# Patient Record
Sex: Male | Born: 1965 | Race: White | Hispanic: No | Marital: Married | Smoking: Never smoker
Health system: Southern US, Community
[De-identification: ages and names within clinical notes are randomized; demographics above are authoritative.]

## PROBLEM LIST (undated history)

## (undated) DIAGNOSIS — S99919A Unspecified injury of unspecified ankle, initial encounter: Secondary | ICD-10-CM

## (undated) DIAGNOSIS — M545 Low back pain: Secondary | ICD-10-CM

## (undated) DIAGNOSIS — G4733 Obstructive sleep apnea (adult) (pediatric): Secondary | ICD-10-CM

## (undated) DIAGNOSIS — Z8619 Personal history of other infectious and parasitic diseases: Secondary | ICD-10-CM

## (undated) DIAGNOSIS — Q248 Other specified congenital malformations of heart: Secondary | ICD-10-CM

## (undated) DIAGNOSIS — F4310 Post-traumatic stress disorder, unspecified: Secondary | ICD-10-CM

## (undated) HISTORY — DX: Unspecified injury of unspecified ankle, initial encounter: S99.919A

## (undated) HISTORY — DX: Low back pain: M54.5

## (undated) HISTORY — PX: WISDOM TOOTH EXTRACTION: SHX21

## (undated) HISTORY — PX: HAND SURGERY: SHX662

## (undated) HISTORY — DX: Personal history of other infectious and parasitic diseases: Z86.19

## (undated) HISTORY — DX: Other specified congenital malformations of heart: Q24.8

---
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
## 2003-11-14 ENCOUNTER — Emergency Department (HOSPITAL_COMMUNITY): Admission: EM | Admit: 2003-11-14 | Discharge: 2003-11-14 | Payer: Self-pay | Admitting: Emergency Medicine

## 2011-03-25 ENCOUNTER — Ambulatory Visit
Admission: RE | Admit: 2011-03-25 | Discharge: 2011-03-25 | Disposition: A | Discharge status: Home / Self Care | Payer: Worker's Compensation | Source: Ambulatory Visit | Attending: Occupational Medicine | Admitting: Occupational Medicine

## 2011-03-25 ENCOUNTER — Other Ambulatory Visit: Payer: Self-pay | Admitting: Occupational Medicine

## 2011-03-25 DIAGNOSIS — R52 Pain, unspecified: Secondary | ICD-10-CM

## 2011-03-25 DIAGNOSIS — IMO0001 Reserved for inherently not codable concepts without codable children: Secondary | ICD-10-CM

## 2011-03-25 DIAGNOSIS — R609 Edema, unspecified: Secondary | ICD-10-CM

## 2011-10-20 ENCOUNTER — Ambulatory Visit: Payer: Self-pay

## 2011-10-20 ENCOUNTER — Other Ambulatory Visit: Payer: Self-pay | Admitting: Occupational Medicine

## 2011-10-20 DIAGNOSIS — M25519 Pain in unspecified shoulder: Secondary | ICD-10-CM

## 2012-04-11 ENCOUNTER — Emergency Department (HOSPITAL_COMMUNITY)
Admission: EM | Admit: 2012-04-11 | Discharge: 2012-04-11 | Disposition: A | Discharge status: Home / Self Care | Payer: 59 | Source: Home / Self Care | Attending: Family Medicine | Admitting: Family Medicine

## 2012-04-11 ENCOUNTER — Encounter (HOSPITAL_COMMUNITY): Payer: Self-pay | Admitting: *Deleted

## 2012-04-11 DIAGNOSIS — S39012A Strain of muscle, fascia and tendon of lower back, initial encounter: Secondary | ICD-10-CM

## 2012-04-11 DIAGNOSIS — S335XXA Sprain of ligaments of lumbar spine, initial encounter: Secondary | ICD-10-CM

## 2012-04-11 LAB — POCT URINALYSIS DIP (DEVICE)
Leukocytes, UA: NEGATIVE
Nitrite: NEGATIVE
Protein, ur: NEGATIVE mg/dL
Specific Gravity, Urine: 1.02 (ref 1.005–1.030)

## 2012-04-11 MED ORDER — CYCLOBENZAPRINE HCL 5 MG PO TABS: 5.0000 mg | ORAL_TABLET | Freq: Three times a day (TID) | ORAL | Status: DC | PRN

## 2012-04-11 MED ORDER — DICLOFENAC POTASSIUM 50 MG PO TABS: 50.0000 mg | ORAL_TABLET | Freq: Three times a day (TID) | ORAL | Status: DC

## 2012-04-11 NOTE — ED Notes (Signed)
Pt  Has  bac  Pain   He  Reports  It  Started  About  1  Week  Ago  He  Reports  It  Was    Worse  Last  Pm      He  denys  Any  specefic  Injury                He  Dens   a ny  Other  Symptoms

## 2012-04-11 NOTE — ED Provider Notes (Signed)
History     CSN: [id number]  Arrival date & time 04/11/12  0945   First MD Initiated Contact with Patient 04/11/12 1013      Chief Complaint  Patient presents with  . Back Pain    (Consider location/radiation/quality/duration/timing/severity/associated sxs/prior treatment) Patient is a 46 y.o. male presenting with back pain. The history is provided by the patient and the spouse.  Back Pain  This is a new problem. The current episode started more than 1 week ago. The problem has been gradually worsening. The pain is associated with no known injury. The pain is present in the lumbar spine. The pain does not radiate. The pain is mild. The symptoms are aggravated by bending. Pertinent negatives include no chest pain, no abdominal pain, no bowel incontinence, no bladder incontinence, no dysuria, no pelvic pain, no tingling and no weakness.    History reviewed. No pertinent past medical history.  History reviewed. No pertinent past surgical history.  No family history on file.  History  Substance Use Topics  . Smoking status: Not on file  . Smokeless tobacco: Not on file  . Alcohol Use: Not on file      Review of Systems  Constitutional: Negative.   Respiratory: Negative for cough.   Cardiovascular: Negative for chest pain.  Gastrointestinal: Negative.  Negative for abdominal pain and bowel incontinence.  Genitourinary: Negative for bladder incontinence, dysuria and pelvic pain.  Musculoskeletal: Positive for back pain. Negative for gait problem.  Neurological: Negative for tingling and weakness.    Allergies  Review of patient's allergies indicates no known allergies.  Home Medications   Current Outpatient Rx  Name Route Sig Dispense Refill  . CYCLOBENZAPRINE HCL 10 MG PO TABS Oral Take 10 mg by mouth 3 (three) times daily as needed.    . CYCLOBENZAPRINE HCL 5 MG PO TABS Oral Take 1 tablet (5 mg total) by mouth 3 (three) times daily as needed for muscle spasms. 30  tablet 0  . DICLOFENAC POTASSIUM 50 MG PO TABS Oral Take 1 tablet (50 mg total) by mouth 3 (three) times daily. For back pain 30 tablet 0    BP 124/83  Pulse 64  Temp 98.2 F (36.8 C) (Oral)  Resp 16  SpO2 100%  Physical Exam  Nursing note and vitals reviewed. Constitutional: He is oriented to person, place, and time. He appears well-developed and well-nourished.  Abdominal: Soft. Bowel sounds are normal.  Musculoskeletal: He exhibits tenderness.       Lumbar back: He exhibits tenderness and spasm.       Back:  Neurological: He is alert and oriented to person, place, and time.  Skin: Skin is warm and dry.    ED Course  Procedures (including critical care time)  Labs Reviewed  POCT URINALYSIS DIP (DEVICE) - Abnormal; Notable for the following:    Hgb urine dipstick TRACE (*)     All other components within normal limits   No results found.   1. Strain of lumbar paraspinous muscle       MDM          Linna Hoff, MD 04/11/12 1103

## 2012-06-28 DIAGNOSIS — M545 Low back pain, unspecified: Secondary | ICD-10-CM

## 2012-06-28 DIAGNOSIS — S99919A Unspecified injury of unspecified ankle, initial encounter: Secondary | ICD-10-CM

## 2012-06-28 HISTORY — PX: SHOULDER ARTHROSCOPY WITH ROTATOR CUFF REPAIR: SHX5685

## 2012-06-28 HISTORY — DX: Low back pain, unspecified: M54.50

## 2012-06-28 HISTORY — PX: ANKLE RECONSTRUCTION: SHX1151

## 2012-06-28 HISTORY — DX: Unspecified injury of unspecified ankle, initial encounter: S99.919A

## 2013-02-08 ENCOUNTER — Emergency Department: Payer: Self-pay | Admitting: Emergency Medicine

## 2013-02-08 LAB — URINALYSIS, COMPLETE
Bilirubin,UR: NEGATIVE
Blood: NEGATIVE
Ketone: NEGATIVE
Leukocyte Esterase: NEGATIVE
Nitrite: NEGATIVE
Specific Gravity: 1.004 (ref 1.003–1.030)
Squamous Epithelial: NONE SEEN
WBC UR: <1 /HPF (ref 0–5)

## 2013-02-08 LAB — DRUG SCREEN, URINE
Barbiturates, Ur Screen: NEGATIVE (ref ?–200)
Benzodiazepine, Ur Scrn: POSITIVE (ref ?–200)
Methadone, Ur Screen: NEGATIVE (ref ?–300)
Phencyclidine (PCP) Ur S: NEGATIVE (ref ?–25)

## 2013-02-08 LAB — CBC
HCT: 40.6 % (ref 40.0–52.0)
HGB: 14.7 g/dL (ref 13.0–18.0)
MCH: 32 pg (ref 26.0–34.0)
MCHC: 36.3 g/dL — ABNORMAL HIGH (ref 32.0–36.0)
RDW: 11.6 % (ref 11.5–14.5)

## 2013-02-08 LAB — COMPREHENSIVE METABOLIC PANEL
Bilirubin,Total: 0.3 mg/dL (ref 0.2–1.0)
Calcium, Total: 9.3 mg/dL (ref 8.5–10.1)
Chloride: 105 mmol/L (ref 98–107)
Creatinine: 1.14 mg/dL (ref 0.60–1.30)
EGFR (Non-African Amer.): >60
Glucose: 102 mg/dL — ABNORMAL HIGH (ref 65–99)
Potassium: 3.8 mmol/L (ref 3.5–5.1)
SGOT(AST): 59 U/L — ABNORMAL HIGH (ref 15–37)
Sodium: 139 mmol/L (ref 136–145)

## 2013-02-23 ENCOUNTER — Encounter (INDEPENDENT_AMBULATORY_CARE_PROVIDER_SITE_OTHER): Payer: 59

## 2013-02-23 DIAGNOSIS — R609 Edema, unspecified: Secondary | ICD-10-CM

## 2013-02-23 DIAGNOSIS — M79609 Pain in unspecified limb: Secondary | ICD-10-CM

## 2013-06-28 HISTORY — PX: COLONOSCOPY: SHX174

## 2014-04-17 ENCOUNTER — Ambulatory Visit (INDEPENDENT_AMBULATORY_CARE_PROVIDER_SITE_OTHER): Payer: 59

## 2014-04-17 ENCOUNTER — Ambulatory Visit (INDEPENDENT_AMBULATORY_CARE_PROVIDER_SITE_OTHER): Payer: 59 | Admitting: Family Medicine

## 2014-04-17 VITALS — BP 152/90 | HR 73 | Temp 98.5°F | Resp 16 | Ht 75.25 in | Wt 194.2 lb

## 2014-04-17 DIAGNOSIS — M545 Low back pain, unspecified: Secondary | ICD-10-CM

## 2014-04-17 DIAGNOSIS — K5900 Constipation, unspecified: Secondary | ICD-10-CM

## 2014-04-17 LAB — POCT URINALYSIS DIPSTICK
Bilirubin, UA: NEGATIVE
GLUCOSE UA: NEGATIVE
Leukocytes, UA: NEGATIVE
Nitrite, UA: NEGATIVE
PROTEIN UA: NEGATIVE
RBC UA: NEGATIVE
SPEC GRAV UA: 1.025
UROBILINOGEN UA: 2
pH, UA: 7

## 2014-04-17 LAB — POCT SEDIMENTATION RATE: POCT SED RATE: 4 mm/h (ref 0–22)

## 2014-04-17 LAB — COMPREHENSIVE METABOLIC PANEL
ALBUMIN: 4.8 g/dL (ref 3.5–5.2)
ALT: 20 U/L (ref 0–53)
AST: 24 U/L (ref 0–37)
Alkaline Phosphatase: 70 U/L (ref 39–117)
BUN: 16 mg/dL (ref 6–23)
CO2: 31 meq/L (ref 19–32)
Calcium: 10.1 mg/dL (ref 8.4–10.5)
Chloride: 101 mEq/L (ref 96–112)
Creat: 1.14 mg/dL (ref 0.50–1.35)
GLUCOSE: 91 mg/dL (ref 70–99)
POTASSIUM: 4.9 meq/L (ref 3.5–5.3)
Sodium: 138 mEq/L (ref 135–145)
TOTAL PROTEIN: 7.3 g/dL (ref 6.0–8.3)
Total Bilirubin: 0.8 mg/dL (ref 0.2–1.2)

## 2014-04-17 LAB — POCT CBC
GRANULOCYTE PERCENT: 70.2 % (ref 37–80)
HCT, POC: 48.7 % (ref 43.5–53.7)
HEMOGLOBIN: 16.3 g/dL (ref 14.1–18.1)
LYMPH, POC: 1.8 (ref 0.6–3.4)
MCH, POC: 31 pg (ref 27–31.2)
MCHC: 33.5 g/dL (ref 31.8–35.4)
MCV: 92.6 fL (ref 80–97)
MID (CBC): 0.3 (ref 0–0.9)
MPV: 7.4 fL (ref 0–99.8)
POC Granulocyte: 4.9 (ref 2–6.9)
POC LYMPH %: 26.1 % (ref 10–50)
POC MID %: 3.7 %M (ref 0–12)
Platelet Count, POC: 291 10*3/uL (ref 142–424)
RBC: 5.26 M/uL (ref 4.69–6.13)
RDW, POC: 12 %
WBC: 7 10*3/uL (ref 4.6–10.2)

## 2014-04-17 LAB — TSH: TSH: 1.098 u[IU]/mL (ref 0.350–4.500)

## 2014-04-17 LAB — POCT UA - MICROSCOPIC ONLY
Casts, Ur, LPF, POC: NEGATIVE
Crystals, Ur, HPF, POC: NEGATIVE
YEAST UA: NEGATIVE

## 2014-04-17 LAB — IFOBT (OCCULT BLOOD): IFOBT: NEGATIVE

## 2014-04-17 MED ORDER — MELOXICAM 7.5 MG PO TABS: 7.5000 mg | ORAL_TABLET | Freq: Every day | ORAL | Status: DC | PRN

## 2014-04-17 MED ORDER — CYCLOBENZAPRINE HCL 10 MG PO TABS: 10.0000 mg | ORAL_TABLET | Freq: Three times a day (TID) | ORAL | Status: DC | PRN

## 2014-04-17 NOTE — Progress Notes (Signed)
Subjective:    Patient ID: William Robbins, male    DOB: 12-23-1965, 48 y.o.   MRN: [id number] Chief Complaint  Patient presents with  . Constipation    X 3 weeks, off and on   . Abdominal Cramping    X 3 weeks  . Back Pain    Low, X 3 weeks    HPI  Has increased fiber in diet - high fiber cereal and salad daily, push fluids - esp water.  First started with stool softener/laxative followed by enema. Went to [facility] in [place] and told blockage was around cecum so enema wouldn't work - was put on miralax daily and BM began but then stopped miralax but constipation recurred so started miralax back 2d ago.  Last BM was 2d ago and was liquidy/runny.  Then had some larger rabbit pellets but since then things have clogged back up. No personal h/o constipations - prior to this he was always very regulary  - looser stools about once a day. Taking ibuprofen 800mg  qd-bid for back pain and then  Prn robaxin 750 which hasn't helped at all. No famhx of GI prov. No n/v, no f/c. Has had a problem with dysuria mild - was treated for an infection around 6 mos prev which did help but no sxs do recur occ.  Never saw urology.  No hematochezia or melena.  Has been seen at [facility] in [place] but would like to see [facility] in [place] but can't get a new pt appointment till Feb and [facility] was to busy to work him in.  History reviewed. No pertinent past medical history. Current Outpatient Prescriptions on File Prior to Visit  Medication Sig Dispense Refill  . cyclobenzaprine (FLEXERIL) 10 MG tablet Take 10 mg by mouth 3 (three) times daily as needed.      . cyclobenzaprine (FLEXERIL) 5 MG tablet Take 1 tablet (5 mg total) by mouth 3 (three) times daily as needed for muscle spasms.  30 tablet  0  . diclofenac (CATAFLAM) 50 MG tablet Take 1 tablet (50 mg total) by mouth 3 (three) times daily. For back pain  30 tablet  0   No current facility-administered medications on file prior to visit.   No Known  Allergies     Review of Systems  Constitutional: Negative for fever, chills, diaphoresis, activity change, appetite change and unexpected weight change.  Respiratory: Negative for shortness of breath.   Cardiovascular: Negative for chest pain.  Gastrointestinal: Positive for abdominal pain, diarrhea and constipation. Negative for nausea, vomiting, blood in stool, abdominal distention, anal bleeding and rectal pain.  Genitourinary: Positive for dysuria and flank pain. Negative for urgency, frequency, decreased urine volume and difficulty urinating.  Musculoskeletal: Positive for arthralgias, back pain and myalgias. Negative for gait problem and joint swelling.  Skin: Negative for color change and wound.  Neurological: Negative for dizziness, weakness, light-headedness and numbness.  Hematological: Negative for adenopathy.       Objective:  BP 152/90  Pulse 73  Temp(Src) 98.5 F (36.9 C) (Oral)  Resp 16  Ht 6' 3.25" (1.911 m)  Wt 194 lb 3.2 oz (88.089 kg)  BMI 24.12 kg/m2  SpO2 98%  Physical Exam  Constitutional: He is oriented to person, place, and time. He appears well-developed and well-nourished. No distress.  HENT:  Head: Normocephalic and atraumatic.  Neck: Normal range of motion. Neck supple. No thyromegaly present.  Cardiovascular: Normal rate, regular rhythm, normal heart sounds and intact distal pulses.   Pulmonary/Chest:  Effort normal and breath sounds normal.  Abdominal: Soft. Normal appearance and bowel sounds are normal. He exhibits no distension and no mass. There is generalized tenderness. There is no rigidity, no rebound, no guarding and no CVA tenderness. No hernia.  Genitourinary: Rectum normal and prostate normal. Rectal exam shows no external hemorrhoid, no internal hemorrhoid, no fissure, no mass, no tenderness and anal tone normal. Guaiac negative stool. Prostate is not enlarged and not tender.  Musculoskeletal: Normal range of motion. He exhibits tenderness.  He exhibits no edema.       Thoracic back: He exhibits tenderness and spasm. He exhibits normal range of motion, no bony tenderness, no swelling and no deformity.       Lumbar back: He exhibits tenderness and spasm. He exhibits normal range of motion, no bony tenderness, no edema and no deformity.  Negative straight leg raise bilaterally  Lymphadenopathy:    He has no cervical adenopathy.  Neurological: He is alert and oriented to person, place, and time. He has normal strength and normal reflexes. He displays no atrophy. No sensory deficit. He exhibits normal muscle tone. Coordination and gait normal.  Reflex Scores:      Patellar reflexes are 2+ on the right side and 2+ on the left side.      Achilles reflexes are 2+ on the right side and 2+ on the left side. Skin: Skin is warm and dry. No rash noted. He is not diaphoretic. No erythema.  Psychiatric: He has a normal mood and affect. His behavior is normal.         [facility] reading (PRIMARY) by  Dr. Brigitte Pulse. KUB: moderate stool burden, worse in ascending colon, no sig abnormality in gas pattern, no stool in rectal vault. L-spine: no acute abnormality, disc spaces well preserved.  EXAM: LUMBAR SPINE - COMPLETE 4+ VIEW  COMPARISON: None.  FINDINGS: Five views of lumbar spine submitted. No acute fracture or subluxation. Alignment and vertebral body heights are preserved. Minimal anterior spurring upper endplate of L4 and L5 vertebral body.  IMPRESSION: No acute fracture or subluxation. Minimal anterior spurring upper endplate of L4 and L5 vertebral body. Alignment and vertebral body heights are preserved.     Assessment & Plan:   Constipation, unspecified constipation type - Plan: POCT CBC, IFOBT POC (occult bld, rslt in office), POCT SEDIMENTATION RATE, POCT UA - Microscopic Only, POCT urinalysis dipstick, Comprehensive metabolic panel, TSH, Urine culture, DG Abd 1 View, Ambulatory referral to Gastroenterology - still w/ persistent  increased stool volume in ascending colon- advise miralax cleanout with 14 capfuls in 64 oz of clear liquid drank w/in 24 hrs - if diarrhea occurs early, keep on going as may be leaking around stool. Consitapation is a new problem for the past sev wks and has failed usual otc methods so agree w/ GI referral (pt would like to est w/ [facility]) - unlikely to be due to any L5 nerve impingement consider normal anal tone, normal anal wink, and no other concerning neurologic changes.  Right-sided low back pain without sciatica - Plan: Urine culture, DG Lumbar Spine Complete - advise heat, mobic w/ prn flexeril. If persists >6 wks, rtc for further eval.  Meds ordered this encounter  Medications  . DISCONTD: methocarbamol (ROBAXIN) 750 MG tablet    Sig: Take 750 mg by mouth 4 (four) times daily.  . cyclobenzaprine (FLEXERIL) 10 MG tablet    Sig: Take 1 tablet (10 mg total) by mouth 3 (three) times daily as needed for muscle  spasms.    Dispense:  30 tablet    Refill:  1  . meloxicam (MOBIC) 7.5 MG tablet    Sig: Take 1 tablet (7.5 mg total) by mouth daily as needed for pain.    Dispense:  30 tablet    Refill:  0    Delman Cheadle, MD MPH   Results for orders placed in visit on 04/17/14  URINE CULTURE      Result Value Ref Range   Colony Count NO GROWTH     Organism ID, Bacteria NO GROWTH    COMPREHENSIVE METABOLIC PANEL      Result Value Ref Range   Sodium 138  135 - 145 mEq/L   Potassium 4.9  3.5 - 5.3 mEq/L   Chloride 101  96 - 112 mEq/L   CO2 31  19 - 32 mEq/L   Glucose, Bld 91  70 - 99 mg/dL   BUN 16  6 - 23 mg/dL   Creat 1.14  0.50 - 1.35 mg/dL   Total Bilirubin 0.8  0.2 - 1.2 mg/dL   Alkaline Phosphatase 70  39 - 117 U/L   AST 24  0 - 37 U/L   ALT 20  0 - 53 U/L   Total Protein 7.3  6.0 - 8.3 g/dL   Albumin 4.8  3.5 - 5.2 g/dL   Calcium 10.1  8.4 - 10.5 mg/dL  TSH      Result Value Ref Range   TSH 1.098  0.350 - 4.500 uIU/mL  POCT CBC      Result Value Ref Range   WBC 7.0  4.6 -  10.2 K/uL   Lymph, poc 1.8  0.6 - 3.4   POC LYMPH PERCENT 26.1  10 - 50 %L   MID (cbc) 0.3  0 - 0.9   POC MID % 3.7  0 - 12 %M   POC Granulocyte 4.9  2 - 6.9   Granulocyte percent 70.2  37 - 80 %G   RBC 5.26  4.69 - 6.13 M/uL   Hemoglobin 16.3  14.1 - 18.1 g/dL   HCT, POC 48.7  43.5 - 53.7 %   MCV 92.6  80 - 97 fL   MCH, POC 31.0  27 - 31.2 pg   MCHC 33.5  31.8 - 35.4 g/dL   RDW, POC 12.0     Platelet Count, POC 291  142 - 424 K/uL   MPV 7.4  0 - 99.8 fL  IFOBT (OCCULT BLOOD)      Result Value Ref Range   IFOBT Negative    POCT SEDIMENTATION RATE      Result Value Ref Range   POCT SED RATE 4  0 - 22 mm/hr  POCT UA - MICROSCOPIC ONLY      Result Value Ref Range   WBC, Ur, HPF, POC 0-3     RBC, urine, microscopic 0-1     Bacteria, U Microscopic trace     Mucus, UA trace     Epithelial cells, urine per micros 0-2     Crystals, Ur, HPF, POC neg     Casts, Ur, LPF, POC neg     Yeast, UA neg    POCT URINALYSIS DIPSTICK      Result Value Ref Range   Color, UA yellow     Clarity, UA clear     Glucose, UA neg     Bilirubin, UA neg     Ketones, UA trace     Spec  Grav, UA 1.025     Blood, UA neg     pH, UA 7.0     Protein, UA neg     Urobilinogen, UA 2.0     Nitrite, UA neg     Leukocytes, UA Negative

## 2014-04-17 NOTE — Patient Instructions (Signed)
Start twice a day miralax. In addition to make sure your constipation is fully treated you can augment with Senokot S and/or magnesium citrate which are available over the counter.   You should consider doing a miralax clean-out.  Put 14 (not kidding) doses of miralax (polyethylene glycol) into 64 oz of any clear, non-carbonated liquid (apple juice, gatorade, water is fine) and drink this WITHIN 24 HOURS!!!!  For the next 2d, hang around the toilet as you will hopefully be having 8 BM/day of LIQUID.  Constipation Constipation is when a person has fewer than three bowel movements a week, has difficulty having a bowel movement, or has stools that are dry, hard, or larger than normal. As people grow older, constipation is more common. If you try to fix constipation with medicines that make you have a bowel movement (laxatives), the problem may get worse. Long-term laxative use may cause the muscles of the colon to become weak. A low-fiber diet, not taking in enough fluids, and taking certain medicines may make constipation worse.  CAUSES   Certain medicines, such as antidepressants, pain medicine, iron supplements, antacids, and water pills.   Certain diseases, such as diabetes, irritable bowel syndrome (IBS), thyroid disease, or depression.   Not drinking enough water.   Not eating enough fiber-rich foods.   Stress or travel.   Lack of physical activity or exercise.   Ignoring the urge to have a bowel movement.   Using laxatives too much.  SIGNS AND SYMPTOMS   Having fewer than three bowel movements a week.   Straining to have a bowel movement.   Having stools that are hard, dry, or larger than normal.   Feeling full or bloated.   Pain in the lower abdomen.   Not feeling relief after having a bowel movement.  DIAGNOSIS  Your health care provider will take a medical history and perform a physical exam. Further testing may be done for severe constipation. Some tests may  include:  A barium enema X-ray to examine your rectum, colon, and, sometimes, your small intestine.   A sigmoidoscopy to examine your lower colon.   A colonoscopy to examine your entire colon. TREATMENT  Treatment will depend on the severity of your constipation and what is causing it. Some dietary treatments include drinking more fluids and eating more fiber-rich foods. Lifestyle treatments may include regular exercise. If these diet and lifestyle recommendations do not help, your health care provider may recommend taking over-the-counter laxative medicines to help you have bowel movements. Prescription medicines may be prescribed if over-the-counter medicines do not work.  HOME CARE INSTRUCTIONS   Eat foods that have a lot of fiber, such as fruits, vegetables, whole grains, and beans.  Limit foods high in fat and processed sugars, such as french fries, hamburgers, cookies, candies, and soda.   A fiber supplement may be added to your diet if you cannot get enough fiber from foods.   Drink enough fluids to keep your urine clear or pale yellow.   Exercise regularly or as directed by your health care provider.   Go to the restroom when you have the urge to go. Do not hold it.   Only take over-the-counter or prescription medicines as directed by your health care provider. Do not take other medicines for constipation without talking to your health care provider first.  [place] IF:   You have bright red blood in your stool.   Your constipation lasts for more than 4 days or  gets worse.   You have abdominal or rectal pain.   You have thin, pencil-like stools.   You have unexplained weight loss. MAKE SURE YOU:   Understand these instructions.  Will watch your condition.  Will get help right away if you are not doing well or get worse. Document Released: 03/12/2004 Document Revised: 06/19/2013 Document Reviewed: 03/26/2013 [facility] Patient  Information 2015 [place], [place]. This information is not intended to replace advice given to you by your health care provider. Make sure you discuss any questions you have with your health care provider.

## 2014-04-18 LAB — URINE CULTURE
Colony Count: NO GROWTH
Organism ID, Bacteria: NO GROWTH

## 2014-04-20 ENCOUNTER — Encounter: Payer: Self-pay | Admitting: Family Medicine

## 2014-04-20 NOTE — Progress Notes (Deleted)
   Subjective:    Patient ID: William Robbins, male    DOB: 1965/07/02, 48 y.o.   MRN: [id number]  HPI    Review of Systems     Objective:   Physical Exam    [facility] reading (PRIMARY) by  Dr. Brigitte Pulse.    CLINICAL DATA: Right side back pain without sciatica  EXAM: LUMBAR SPINE - COMPLETE 4+ VIEW  COMPARISON: None.  FINDINGS: Five views of lumbar spine submitted. No acute fracture or subluxation. Alignment and vertebral body heights are preserved. Minimal anterior spurring upper endplate of L4 and L5 vertebral body.  IMPRESSION: No acute fracture or subluxation. Minimal anterior spurring upper endplate of L4 and L5 vertebral body. Alignment and vertebral body heights are preserved.    Assessment & Plan:

## 2014-04-25 ENCOUNTER — Ambulatory Visit
Admission: RE | Admit: 2014-04-25 | Discharge: 2014-04-25 | Disposition: A | Discharge status: Home / Self Care | Payer: 59 | Source: Ambulatory Visit | Attending: Family Medicine | Admitting: Family Medicine

## 2014-04-25 ENCOUNTER — Encounter: Payer: Self-pay | Admitting: Nurse Practitioner

## 2014-04-25 ENCOUNTER — Ambulatory Visit (INDEPENDENT_AMBULATORY_CARE_PROVIDER_SITE_OTHER): Payer: 59 | Admitting: Family Medicine

## 2014-04-25 ENCOUNTER — Ambulatory Visit (INDEPENDENT_AMBULATORY_CARE_PROVIDER_SITE_OTHER): Payer: 59

## 2014-04-25 VITALS — BP 118/86 | HR 74 | Temp 98.1°F | Resp 18 | Ht 75.0 in | Wt 194.2 lb

## 2014-04-25 DIAGNOSIS — K5900 Constipation, unspecified: Secondary | ICD-10-CM

## 2014-04-25 DIAGNOSIS — R1032 Left lower quadrant pain: Secondary | ICD-10-CM

## 2014-04-25 MED ORDER — IOHEXOL 300 MG/ML  SOLN
100.0000 mL | Freq: Once | INTRAMUSCULAR | Status: AC | PRN
  Administered 2014-04-25: 100 mL via INTRAVENOUS

## 2014-04-25 NOTE — Patient Instructions (Addendum)
Wednesday 11/4 at 10:30 am with Wallace Going at [facility]: [address], [place], [place] [address]  [phone]  Your CT scan of your abdomen and pelvis is at 515pm today at [facility].     Begin drinking first container of contrast at 3:30 and begin second container of contrast at 4:30. After your CT scan today you can go home and I will call you with the result.

## 2014-04-25 NOTE — Progress Notes (Signed)
[facility] [address], [place] [address] [phone]  Date:  04/25/2014   Name:  William Robbins   DOB:  25-Sep-1965   MRN:  [id number]  PCP:  No PCP Per Patient    Chief Complaint: Follow-up   History of Present Illness:  William Robbins is a 48 y.o. very pleasant male patient who presents with the following:  Here today to recheck constipation.  Seen here just over a week ago with constipation off an on for about 3 weeks. He had never had prior issues with constipation.   He has noted some decreased stool caliper over the last couple of months.  He uses the miralax regimen given and did have some stools but then constipation and discomfort returned.  He will often feel that he has excess abdominal gas.  He has noted some occasional stomach cramps.  He has noted some bright red blood both on the tissue and in the toilet bowl over the last few days.  It did seem to be "dripping" from his body.    No weight loss.    He is otherwise generally in good health.  He cannot think of anything that might have triggered this latest episode- no travel, no pain medication use, no illness.     There are no active problems to display for this patient.   History reviewed. No pertinent past medical history.  Past Surgical History  Procedure Laterality Date  . Shoulder arthroscopy with rotator cuff repair    . Ankle reconstruction Right     History  Substance Use Topics  . Smoking status: Never Smoker   . Smokeless tobacco: Not on file  . Alcohol Use: No    Family History  Problem Relation Age of Onset  . Mental illness Maternal Grandmother   . Diabetes Paternal Grandfather     No Known Allergies  Medication list has been reviewed and updated.  Current Outpatient Prescriptions on File Prior to Visit  Medication Sig Dispense Refill  . cyclobenzaprine (FLEXERIL) 10 MG tablet Take 1 tablet (10 mg total) by mouth 3 (three) times daily as needed for muscle  spasms.  30 tablet  1  . diclofenac (CATAFLAM) 50 MG tablet Take 1 tablet (50 mg total) by mouth 3 (three) times daily. For back pain  30 tablet  0  . meloxicam (MOBIC) 7.5 MG tablet Take 1 tablet (7.5 mg total) by mouth daily as needed for pain.  30 tablet  0   No current facility-administered medications on file prior to visit.    Review of Systems:  As per HPI- otherwise negative.   Physical Examination: Filed Vitals:   04/25/14 0849  BP: 118/86  Pulse: 74  Temp: 98.1 F (36.7 C)  Resp: 18   Filed Vitals:   04/25/14 0849  Height: 6\' 3"  (1.905 m)  Weight: 194 lb 3.2 oz (88.089 kg)   Body mass index is 24.27 kg/(m^2). Ideal Body Weight: Weight in (lb) to have BMI = 25: 199.6  GEN: WDWN, NAD, Non-toxic, A & O x 3, looks well HEENT: Atraumatic, Normocephalic. Neck supple. No masses, No LAD. Ears and Nose: No external deformity. CV: RRR, No M/G/R. No JVD. No thrill. No extra heart sounds. PULM: CTA B, no wheezes, crackles, rhonchi. No retractions. No resp. distress. No accessory muscle use. ABD: S, ND, +BS. No rebound. No HSM.  Mild generalized abd pain, more in the LLQ EXTR: No c/c/e NEURO Normal gait.  PSYCH: Normally  interactive. Conversant. Not depressed or anxious appearing.  Calm demeanor.  Rectal: normal, no evidence of bleeding, external hemorrhoid or fissure  [facility] reading (PRIMARY) by  Dr. Lorelei Pont. KUB:  Continued right sided stool in colon ABDOMEN - 1 VIEW  COMPARISON: Lumbar spine films of 04/17/2014  FINDINGS: A supine film of the abdomen shows a moderate amount of feces throughout the colon. No bowel obstruction is seen. No opaque calculi are noted. A calcification in the region of the left adrenal gland is unchanged.  IMPRESSION: Moderate amount of feces throughout the colon. No bowel obstruction.   Assessment and Plan: Constipation, unspecified constipation type - Plan: DG Abd 1 View, CT Abdomen Pelvis W Contrast  LLQ pain - Plan: CT Abdomen  Pelvis W Contrast  Bowel pattern change over the last few months with persistent constipation and some blood in stool. Make appt for him to be seen at GI next week.  Will go ahead with CT scan today to rule- out mass or obstruction.  He will use 1-2 doses of miralax daily to try and maintain his regularity in the interim   Signed Lamar Blinks, MD  Received his CT results:  CT ABDOMEN AND PELVIS WITH CONTRAST  TECHNIQUE: Multidetector CT imaging of the abdomen and pelvis was performed using the standard protocol following bolus administration of intravenous contrast.  CONTRAST: 173mL OMNIPAQUE IOHEXOL 300 MG/ML SOLN  COMPARISON: None.  FINDINGS: BODY WALL: Unremarkable.  LOWER CHEST: Circumscribed 4 cm water density mass at the right cardiophrenic sulcus is consistent with incidental pericardial cyst.  Calcified granuloma in the left lower lobe.  ABDOMEN/PELVIS:  Liver: Presumed cyst in the inferior left lobe liver, 1 cm in diameter. There is a too small to characterize low-density focus in the upper right liver.  Biliary: No evidence of biliary obstruction or stone.  Pancreas: Unremarkable.  Spleen: Unremarkable.  Adrenals: Unremarkable.  Kidneys and ureters: No hydronephrosis or stone.  Bladder: Unremarkable.  Reproductive: Unremarkable.  Bowel: No obstruction. No gross mass lesion, although this is not the most sensitive method for detection. No pericecal inflammation.  Retroperitoneum: No mass or adenopathy.  Peritoneum: No ascites or pneumoperitoneum.  Vascular: No acute abnormality.  OSSEOUS: No acute abnormalities.  IMPRESSION: 1. No findings to explain provided history. Suggest GI followup for consideration of endoscopy. 2. Pericardial cyst.  Called to go over with him.  This is certainly good news.  Will proceed with GI referral but CT is reassuring. He will use 1-2 doses of miralax daily until his appt and let me know if anything changes or  gets worse

## 2014-04-29 ENCOUNTER — Telehealth: Payer: Self-pay | Admitting: Family Medicine

## 2014-04-29 NOTE — Telephone Encounter (Signed)
Spoke with DOD at [facility] cardiology re: incidentally noted pericardial cyst.  Will plan to do a CT or MRI in 6 months to follow-up.  Discussed with pt; he will contact his insurance company and ask about his cost for MRI vs CT of the chest.  He will let me know when he decides which he would prefer to do and I will order for 10/2014 He is about the same from a GI standpoint and will see them in 2 days

## 2014-05-01 ENCOUNTER — Ambulatory Visit (INDEPENDENT_AMBULATORY_CARE_PROVIDER_SITE_OTHER): Payer: 59 | Admitting: Nurse Practitioner

## 2014-05-01 ENCOUNTER — Encounter: Payer: Self-pay | Admitting: Nurse Practitioner

## 2014-05-01 ENCOUNTER — Other Ambulatory Visit (INDEPENDENT_AMBULATORY_CARE_PROVIDER_SITE_OTHER): Payer: 59

## 2014-05-01 VITALS — BP 130/80 | HR 78 | Ht 75.0 in | Wt 193.6 lb

## 2014-05-01 DIAGNOSIS — R194 Change in bowel habit: Secondary | ICD-10-CM

## 2014-05-01 DIAGNOSIS — R309 Painful micturition, unspecified: Secondary | ICD-10-CM

## 2014-05-01 DIAGNOSIS — K5900 Constipation, unspecified: Secondary | ICD-10-CM

## 2014-05-01 LAB — URINALYSIS, ROUTINE W REFLEX MICROSCOPIC
BILIRUBIN URINE: NEGATIVE
HGB URINE DIPSTICK: NEGATIVE
Ketones, ur: NEGATIVE
Leukocytes, UA: NEGATIVE
Nitrite: NEGATIVE
PH: 6.5 (ref 5.0–8.0)
RBC / HPF: NONE SEEN (ref 0–?)
Specific Gravity, Urine: 1.02 (ref 1.000–1.030)
TOTAL PROTEIN, URINE-UPE24: NEGATIVE
URINE GLUCOSE: NEGATIVE
Urobilinogen, UA: 0.2 (ref 0.0–1.0)

## 2014-05-01 LAB — URINALYSIS
BILIRUBIN URINE: NEGATIVE
HGB URINE DIPSTICK: NEGATIVE
KETONES UR: NEGATIVE
LEUKOCYTES UA: NEGATIVE
Nitrite: NEGATIVE
PH: 6.5 (ref 5.0–8.0)
SPECIFIC GRAVITY, URINE: 1.02 (ref 1.000–1.030)
Total Protein, Urine: NEGATIVE
URINE GLUCOSE: NEGATIVE
Urobilinogen, UA: 0.2 (ref 0.0–1.0)

## 2014-05-01 MED ORDER — MOVIPREP 100 G PO SOLR: 1.0000 | Freq: Once | ORAL | Status: DC

## 2014-05-01 NOTE — Progress Notes (Signed)
HPI :   William Robbins is a 48 year old male, new to this practice, referred for evaluation of bowel changes. Historically patient's bowels have consisted of a formed daily BM, " like clockworth". One month ago patient developed constipation along with lower back discomfort. He exercises on a regular basis. Patient has not had any recent medication changes, no dietary changes and his diet is high in fiber. Patient was evaluated by his PCP, CBC and comprehensive metabolic profile were normal. KUB showed moderate stool throughout the colon.  Because of the pain and bowel changes a CT scan of the abdomen and pelvis with contrast was done. No acute findings, bowel was normal. 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 Patient was given a bowel prep and subsequently had several loose stools.  The bowel prep was 2 weeks ago, since then patient has only had 2 or 3 bowel movements. He does have some abdominal cramping. No unexpected weight loss.  History reviewed. No pertinent past medical history.  Family History  Problem Relation Age of Onset  . Mental illness Maternal Grandmother   . Diabetes Paternal Grandfather   . Colon cancer Neg Hx    History  Substance Use Topics  . Smoking status: Never Smoker   . Smokeless tobacco: Never Used  . Alcohol Use: No   Current Outpatient Prescriptions  Medication Sig Dispense Refill  . cyclobenzaprine (FLEXERIL) 10 MG tablet Take 1 tablet (10 mg total) by mouth 3 (three) times daily as needed for muscle spasms. 30 tablet 1  . diclofenac (CATAFLAM) 50 MG tablet Take 1 tablet (50 mg total) by mouth 3 (three) times daily. For back pain (Patient taking differently: Take 50 mg by mouth 3 (three) times daily as needed. For back pain) 30 tablet 0  . meloxicam (MOBIC) 7.5 MG tablet Take 1 tablet (7.5 mg total) by mouth daily as needed for pain. 30 tablet 0  . Polyethylene Glycol 3350 (MIRALAX PO) Take by mouth as needed.     Marland Kitchen MOVIPREP 100 G SOLR Take 1 kit (200 g total)  by mouth once. 1 kit 0   No current facility-administered medications for this visit.   No Known Allergies   Review of Systems: All systems reviewed and negative except where noted in HPI.    Dg Lumbar Spine Complete  04/17/2014   CLINICAL DATA:  Right side back pain without sciatica  EXAM: LUMBAR SPINE - COMPLETE 4+ VIEW  COMPARISON:  None.  FINDINGS: Five views of lumbar spine submitted. No acute fracture or subluxation. Alignment and vertebral body heights are preserved. Minimal anterior spurring upper endplate of L4 and L5 vertebral body.  IMPRESSION: No acute fracture or subluxation. Minimal anterior spurring upper endplate of L4 and L5 vertebral body. Alignment and vertebral body heights are preserved.   Electronically Signed   By: Lahoma Crocker M.D.   On: 04/17/2014 12:21   Dg Abd 1 View  04/25/2014   CLINICAL DATA:  History of constipation  EXAM: ABDOMEN - 1 VIEW  COMPARISON:  Lumbar spine films of 04/17/2014  FINDINGS: A supine film of the abdomen shows a moderate amount of feces throughout the colon. No bowel obstruction is seen. No opaque calculi are noted. A calcification in the region of the left adrenal gland is unchanged.  IMPRESSION: Moderate amount of feces throughout the colon. No bowel obstruction.   Electronically Signed   By: Ivar Drape M.D.   On: 04/25/2014 13:32   Dg Abd 1 View  04/17/2014  CLINICAL DATA:  Constipation  EXAM: ABDOMEN - 1 VIEW  COMPARISON:  None.  FINDINGS: There is nonspecific nonobstructive bowel gas pattern. Moderate stool noted in right colon. No renal calcifications are noted.  IMPRESSION: Nonspecific nonobstructive bowel gas pattern. Moderate stool in right colon.   Electronically Signed   By: Lahoma Crocker M.D.   On: 04/17/2014 12:22   Ct Abdomen Pelvis W Contrast  04/25/2014   CLINICAL DATA:  Stool changes and abdominal pain for 1 month.  EXAM: CT ABDOMEN AND PELVIS WITH CONTRAST  TECHNIQUE: Multidetector CT imaging of the abdomen and pelvis was  performed using the standard protocol following bolus administration of intravenous contrast.  CONTRAST:  145m OMNIPAQUE IOHEXOL 300 MG/ML  SOLN  COMPARISON:  None.  FINDINGS: BODY WALL: Unremarkable.  LOWER CHEST: Circumscribed 4 cm water density mass at the right cardiophrenic sulcus is consistent with incidental pericardial cyst.  Calcified granuloma in the left lower lobe.  ABDOMEN/PELVIS:  Liver: Presumed cyst in the inferior left lobe liver, 1 cm in diameter. There is a too small to characterize low-density focus in the upper right liver.  Biliary: No evidence of biliary obstruction or stone.  Pancreas: Unremarkable.  Spleen: Unremarkable.  Adrenals: Unremarkable.  Kidneys and ureters: No hydronephrosis or stone.  Bladder: Unremarkable.  Reproductive: Unremarkable.  Bowel: No obstruction. No gross mass lesion, although this is not the most sensitive method for detection. No pericecal inflammation.  Retroperitoneum: No mass or adenopathy.  Peritoneum: No ascites or pneumoperitoneum.  Vascular: No acute abnormality.  OSSEOUS: No acute abnormalities.  IMPRESSION: 1. No findings to explain provided history. Suggest GI followup for consideration of endoscopy. 2. Pericardial cyst.   Electronically Signed   By: JJorje GuildM.D.   On: 04/25/2014 18:11    Physical Exam: BP 130/80 mmHg  Pulse 78  Ht 6' 3" (1.905 m)  Wt 193 lb 9.6 oz (87.816 kg)  BMI 24.20 kg/m2 Constitutional: Pleasant,well-developed, white male in no acute distress. HEENT: Normocephalic and atraumatic. Conjunctivae are normal. No scleral icterus. Neck supple.  Cardiovascular: Normal rate, regular rhythm.  Pulmonary/chest: Effort normal and breath sounds normal. No wheezing, rales or rhonchi. Abdominal: Soft, nondistended, nontender. Bowel sounds active throughout. There are no masses palpable. No hepatomegaly. Extremities: no edema Lymphadenopathy: No cervical adenopathy noted. Neurological: Alert and oriented to person place and  time. Skin: Skin is warm and dry. No rashes noted. Psychiatric: Normal mood and affect. Behavior is normal.   ASSESSMENT AND PLAN:   [age]year old healthy male with bowel changes over the last month. Bowel changes began with some loose stool but patient has been constipated since. He has had some associated lower back pain. Recent labs and CT scan of the abdomen and pelvis unrevealing. Etiology of bowel changes not clear. For further evaluation patient will be scheduled for colonoscopy. The risks, benefits, and alternatives to colonoscopy with possible biopsy and possible polypectomy were discussed with the patient and he consents to proceed. Recommended patient start daily Miralax.   2. Dysuria, low back discomfort. Need to exclude UTI. Will obtain u/a and if abnormal will send for culture.   3. Pericardial cyst. CTscan reveals a 4 cm mass at the right cardiophrenic sulcus consistent with incidental pericardial cyst. Patient tells me his PCP is arranging for cardiology evaluation and  follow-up imaging studies. I don't think this will interfere with patient's ability to be sedated for colonoscopy but I will forward this to anesthesia service

## 2014-05-01 NOTE — Patient Instructions (Addendum)
You have been scheduled for a colonoscopy. Please follow written instructions given to you at your visit today.  Please pick up your prep kit at the pharmacy within the next 1-3 days. If you use inhalers (even only as needed), please bring them with you on the day of your procedure. Your physician has requested that you go to www.[email or web address].com(SENT TO E-MAIL)and enter the access code given to you at your visit today. This web site gives a general overview about your procedure. However, you should still follow specific instructions given to you by our office regarding your preparation for the procedure.  Please start your Miralax daily You can mix one capful in eight ounces of juice or water

## 2014-05-02 NOTE — Progress Notes (Signed)
Reviewed and agree with management plan.  Kevontae Burgoon T. Fayette Gasner, MD FACG 

## 2014-05-15 ENCOUNTER — Encounter: Payer: Self-pay | Admitting: Gastroenterology

## 2014-05-15 ENCOUNTER — Ambulatory Visit (AMBULATORY_SURGERY_CENTER): Payer: 59 | Admitting: Gastroenterology

## 2014-05-15 VITALS — BP 126/79 | HR 64 | Temp 97.6°F | Resp 12 | Ht 75.0 in | Wt 193.0 lb

## 2014-05-15 DIAGNOSIS — K5900 Constipation, unspecified: Secondary | ICD-10-CM

## 2014-05-15 DIAGNOSIS — R194 Change in bowel habit: Secondary | ICD-10-CM

## 2014-05-15 MED ORDER — SODIUM CHLORIDE 0.9 % IV SOLN: 500.0000 mL | INTRAVENOUS | Status: DC

## 2014-05-15 NOTE — Patient Instructions (Signed)

## 2014-05-15 NOTE — Op Note (Signed)
[facility]  [address]. [place], [id number]   COLONOSCOPY PROCEDURE REPORT  PATIENT: William Robbins, William Robbins  MR#: [id number] BIRTHDATE: [birth year] , 61  yrs. old GENDER: male ENDOSCOPIST: Ladene Artist, MD, [facility] REFERRED JJ:HERDEYC, Jessica PROCEDURE DATE:  05/15/2014 PROCEDURE:   Colonoscopy, diagnostic First Screening Colonoscopy - Avg.  risk and is 50 yrs.  old or older - No.  Prior Negative Screening - Now for repeat screening. N/A  History of Adenoma - Now for follow-up colonoscopy & has been > or = to 3 yrs.  N/A  Polyps Removed Today? No.  Polyps Removed Today? No.  Recommend repeat exam, <10 yrs? Polyps Removed Today? No.  Recommend repeat exam, <10 yrs? No. ASA CLASS:   Class II INDICATIONS:change in bowel habits and constipation. MEDICATIONS: Monitored anesthesia care and Propofol 300 mg IV DESCRIPTION OF PROCEDURE:   After the risks benefits and alternatives of the procedure were thoroughly explained, informed consent was obtained.  The digital rectal exam revealed no abnormalities of the rectum.   The [id number]  endoscope was introduced through the anus and advanced to the cecum, which was identified by both the appendix and ileocecal valve. No adverse events experienced.   The quality of the prep was excellent, using MoviPrep  The instrument was then slowly withdrawn as the colon was fully examined.  COLON FINDINGS: A normal appearing cecum, ileocecal valve, and appendiceal orifice were identified.  The ascending, transverse, descending, sigmoid colon, and rectum appeared unremarkable. Retroflexed views revealed internal Grade II hemorrhoids. The time to cecum=4 minutes 54 seconds.  Withdrawal time=9 minutes 08 seconds.  The scope was withdrawn and the procedure completed. COMPLICATIONS: There were no immediate complications.  ENDOSCOPIC IMPRESSION: 1.  Normal colonoscopy 2.  Grade II internal hemorrhoids  RECOMMENDATIONS: 1.  Miralax  qd-tid, titrate to need 2.  High fiber diet with liberal fluid intake. 3.  Continue to follow colorectal cancer screening guidelines for "routine risk" patients with a repeat colonoscopy in 10 years. There is no need for routine, screening FOBT (stool) testing for at least 5 years.  eSigned:  Ladene Artist, MD, [facility] 2014-05-15 [id number]

## 2014-05-15 NOTE — Progress Notes (Signed)
A/ox3, pleased with MAC, report to RN 

## 2014-05-16 ENCOUNTER — Telehealth: Payer: Self-pay | Admitting: *Deleted

## 2014-05-16 NOTE — Telephone Encounter (Signed)
  Follow up Call-  Call back number 05/15/2014  Post procedure Call Back phone  # [phone] cell  Permission to leave phone message Yes     Patient questions:  Do you have a fever, pain , or abdominal swelling? No. Pain Score  0 *  Have you tolerated food without any problems? Yes.    Have you been able to return to your normal activities? Yes.    Do you have any questions about your discharge instructions: Diet   No. Medications  No. Follow up visit  No.  Do you have questions or concerns about your Care? No.  Actions: * If pain score is 4 or above: No action needed, pain <4.

## 2014-06-24 ENCOUNTER — Ambulatory Visit: Payer: Self-pay | Admitting: Gastroenterology

## 2014-08-05 ENCOUNTER — Ambulatory Visit (INDEPENDENT_AMBULATORY_CARE_PROVIDER_SITE_OTHER): Payer: 59 | Admitting: Family Medicine

## 2014-08-05 ENCOUNTER — Encounter: Payer: Self-pay | Admitting: Family Medicine

## 2014-08-05 VITALS — BP 132/82 | HR 84 | Temp 98.2°F | Ht 76.0 in | Wt 202.5 lb

## 2014-08-05 DIAGNOSIS — R194 Change in bowel habit: Secondary | ICD-10-CM

## 2014-08-05 DIAGNOSIS — S99911S Unspecified injury of right ankle, sequela: Secondary | ICD-10-CM

## 2014-08-05 DIAGNOSIS — M545 Low back pain, unspecified: Secondary | ICD-10-CM | POA: Insufficient documentation

## 2014-08-05 DIAGNOSIS — S99911A Unspecified injury of right ankle, initial encounter: Secondary | ICD-10-CM | POA: Insufficient documentation

## 2014-08-05 NOTE — Assessment & Plan Note (Signed)
Now resolved, s/p normal colonoscopy

## 2014-08-05 NOTE — Patient Instructions (Addendum)
Double check on date of recent tetanus shot (Td vs Tdap). Return for fasting labs and afterwards for physical after 09/2014.
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
## 2014-08-05 NOTE — Assessment & Plan Note (Signed)
Continue f/u with chiropractor.

## 2014-08-05 NOTE — Assessment & Plan Note (Signed)
S/p surgical repair of lateral ankle ligament

## 2014-08-05 NOTE — Progress Notes (Signed)
Pre visit review using our clinic review tool, if applicable. No additional management support is needed unless otherwise documented below in the visit note. 

## 2015-06-29 DIAGNOSIS — Q248 Other specified congenital malformations of heart: Secondary | ICD-10-CM

## 2015-06-29 HISTORY — DX: Other specified congenital malformations of heart: Q24.8

## 2015-12-22 ENCOUNTER — Encounter: Payer: Self-pay | Admitting: Family Medicine

## 2015-12-22 DIAGNOSIS — Q248 Other specified congenital malformations of heart: Secondary | ICD-10-CM | POA: Insufficient documentation

## 2016-01-05 ENCOUNTER — Other Ambulatory Visit: Payer: Self-pay | Admitting: Cardiology

## 2016-01-05 DIAGNOSIS — Q248 Other specified congenital malformations of heart: Secondary | ICD-10-CM

## 2016-01-08 ENCOUNTER — Other Ambulatory Visit: Payer: Self-pay

## 2016-03-21 IMAGING — CR DG LUMBAR SPINE COMPLETE 4+V
5 series · 5 of 5 positions shown · non-contrast
Comparison: None.

CLINICAL DATA: Right side back pain without sciatica

EXAM:
LUMBAR SPINE - COMPLETE 4+ VIEW

[AP (1 of 2)]
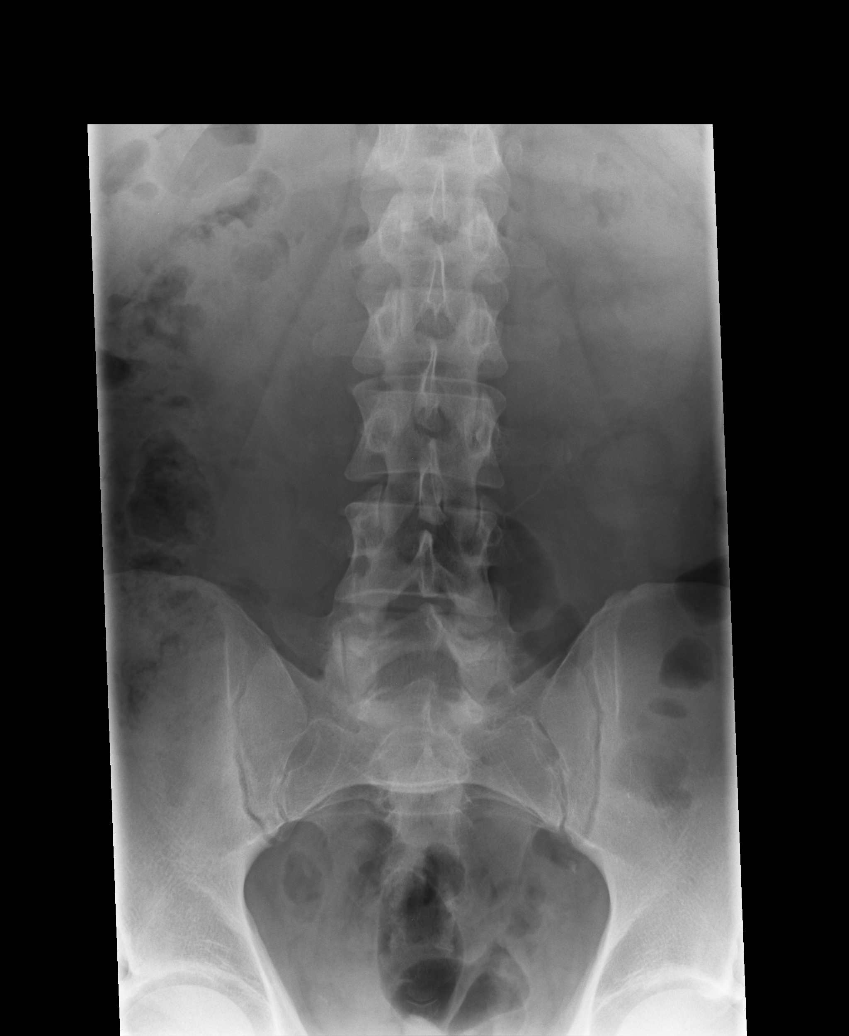

[AP (2 of 2)]
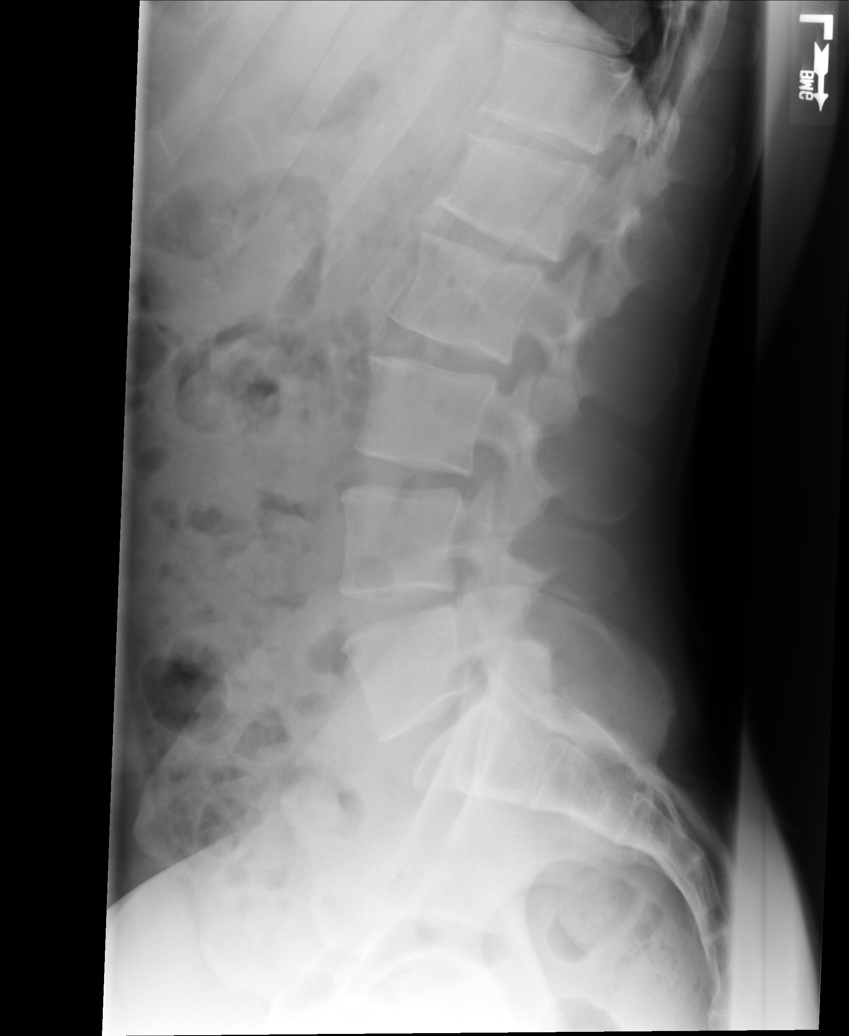

[l5 s1]
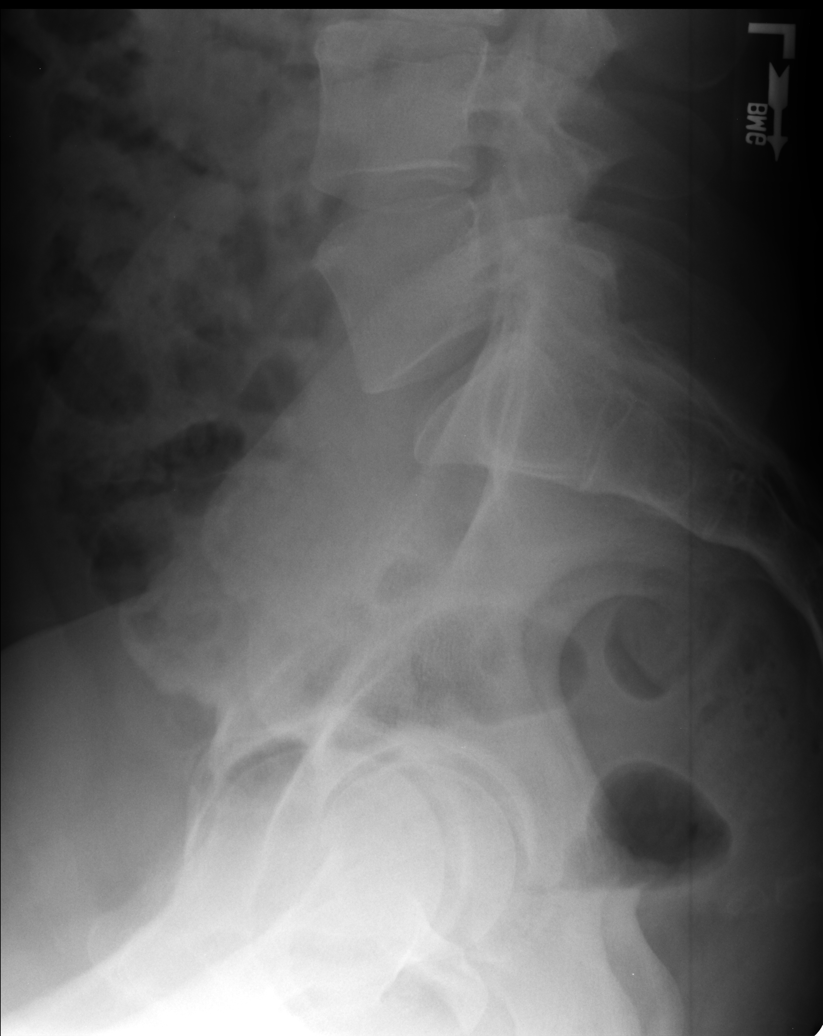

[rpo]
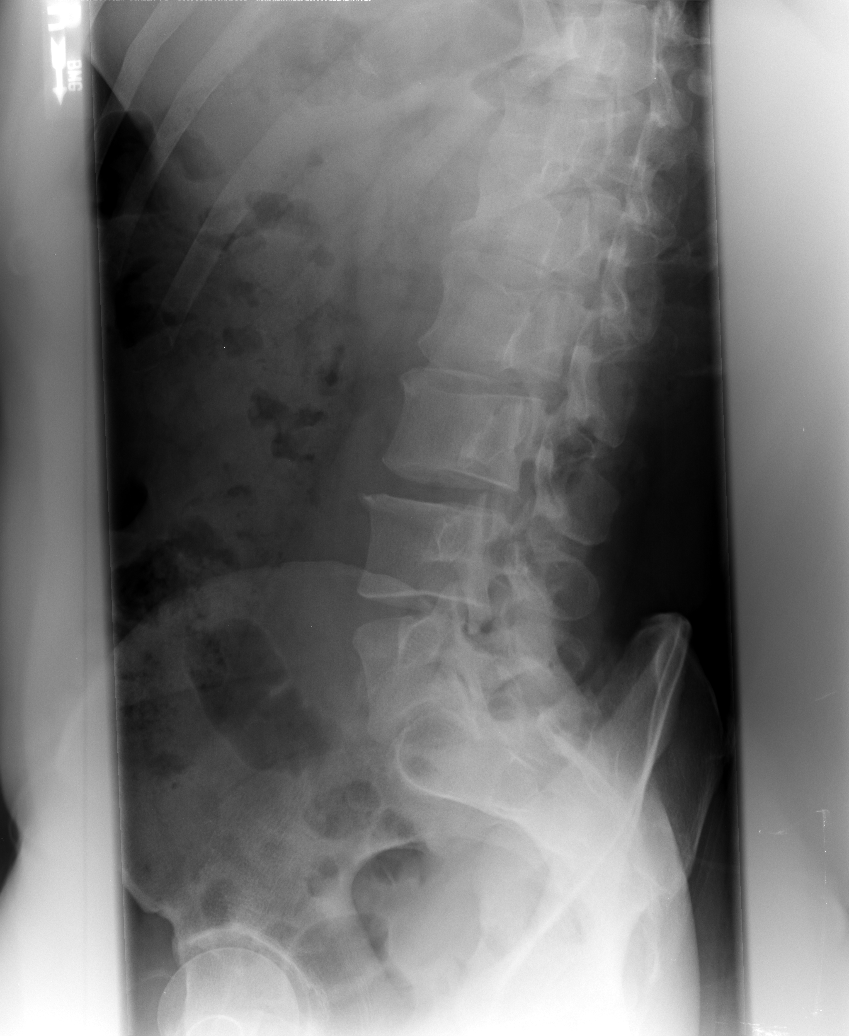

[lpo]
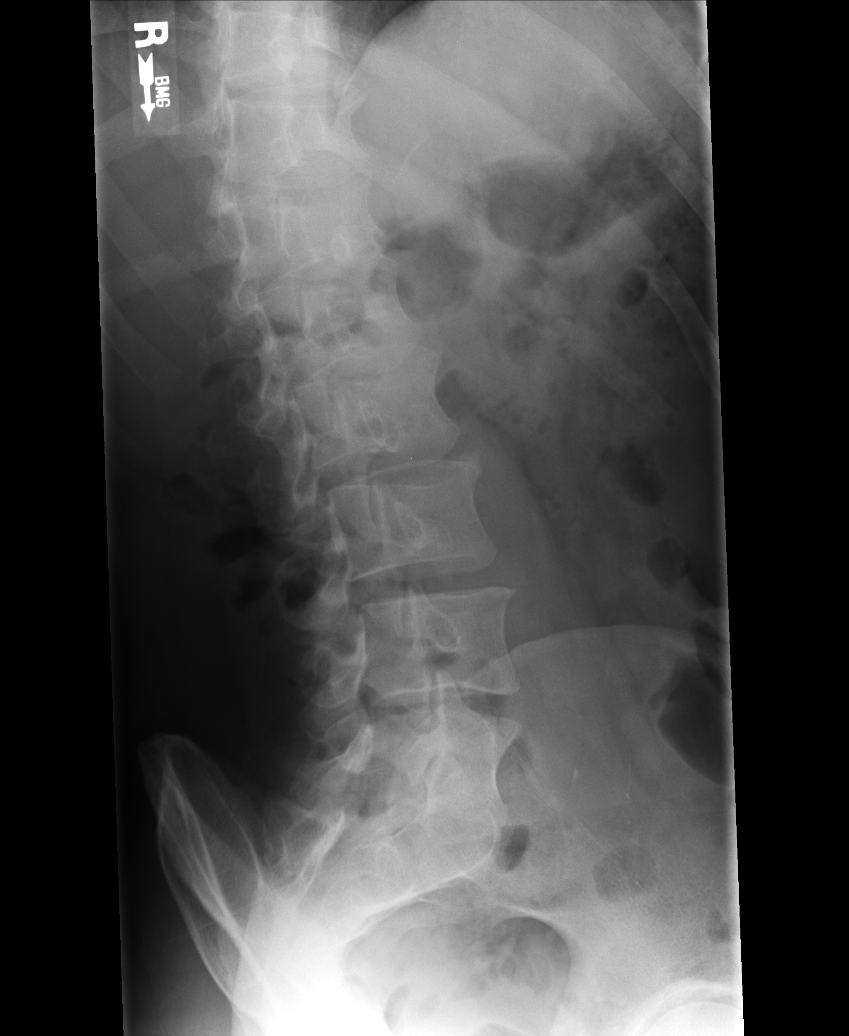

[5 of 5 positions shown; findings below may reference images not displayed]

FINDINGS: Five views of lumbar spine submitted. No acute fracture or
subluxation. Alignment and vertebral body heights are preserved.
Minimal anterior spurring upper endplate of L4 and L5 vertebral
body.
IMPRESSION: No acute fracture or subluxation. Minimal anterior spurring upper
endplate of L4 and L5 vertebral body. Alignment and vertebral body
heights are preserved.

## 2016-07-01 DIAGNOSIS — L820 Inflamed seborrheic keratosis: Secondary | ICD-10-CM | POA: Diagnosis not present

## 2016-07-01 DIAGNOSIS — D485 Neoplasm of uncertain behavior of skin: Secondary | ICD-10-CM | POA: Diagnosis not present

## 2016-10-06 ENCOUNTER — Encounter: Payer: Self-pay | Admitting: Family Medicine

## 2016-10-06 ENCOUNTER — Encounter (HOSPITAL_COMMUNITY): Payer: Self-pay | Admitting: Neurology

## 2016-10-06 ENCOUNTER — Encounter (HOSPITAL_COMMUNITY): Admission: EM | Disposition: A | Discharge status: Home / Self Care | Payer: Self-pay | Source: Home / Self Care

## 2016-10-06 ENCOUNTER — Emergency Department (HOSPITAL_COMMUNITY): Payer: Worker's Compensation

## 2016-10-06 ENCOUNTER — Encounter (HOSPITAL_COMMUNITY): Payer: Self-pay | Admitting: Certified Registered Nurse Anesthetist

## 2016-10-06 ENCOUNTER — Emergency Department (HOSPITAL_COMMUNITY)
Admission: EM | Admit: 2016-10-06 | Discharge: 2016-10-06 | Disposition: A | Discharge status: Home / Self Care | Payer: Worker's Compensation | Attending: Emergency Medicine | Admitting: Emergency Medicine

## 2016-10-06 DIAGNOSIS — W3400XA Accidental discharge from unspecified firearms or gun, initial encounter: Secondary | ICD-10-CM | POA: Diagnosis not present

## 2016-10-06 DIAGNOSIS — S31609A Unspecified open wound of abdominal wall, unspecified quadrant with penetration into peritoneal cavity, initial encounter: Secondary | ICD-10-CM | POA: Diagnosis not present

## 2016-10-06 DIAGNOSIS — S31139A Puncture wound of abdominal wall without foreign body, unspecified quadrant without penetration into peritoneal cavity, initial encounter: Secondary | ICD-10-CM

## 2016-10-06 DIAGNOSIS — R109 Unspecified abdominal pain: Secondary | ICD-10-CM | POA: Insufficient documentation

## 2016-10-06 DIAGNOSIS — S31109A Unspecified open wound of abdominal wall, unspecified quadrant without penetration into peritoneal cavity, initial encounter: Secondary | ICD-10-CM | POA: Diagnosis present

## 2016-10-06 DIAGNOSIS — S301XXA Contusion of abdominal wall, initial encounter: Secondary | ICD-10-CM | POA: Diagnosis not present

## 2016-10-06 DIAGNOSIS — Y929 Unspecified place or not applicable: Secondary | ICD-10-CM | POA: Diagnosis not present

## 2016-10-06 DIAGNOSIS — Z79899 Other long term (current) drug therapy: Secondary | ICD-10-CM | POA: Insufficient documentation

## 2016-10-06 DIAGNOSIS — S3690XA Unspecified injury of unspecified intra-abdominal organ, initial encounter: Secondary | ICD-10-CM | POA: Diagnosis not present

## 2016-10-06 DIAGNOSIS — Y939 Activity, unspecified: Secondary | ICD-10-CM | POA: Insufficient documentation

## 2016-10-06 DIAGNOSIS — Y999 Unspecified external cause status: Secondary | ICD-10-CM | POA: Insufficient documentation

## 2016-10-06 LAB — PREPARE FRESH FROZEN PLASMA
UNIT DIVISION: 0
Unit division: 0

## 2016-10-06 LAB — COMPREHENSIVE METABOLIC PANEL
ALT: 29 U/L (ref 17–63)
AST: 33 U/L (ref 15–41)
Albumin: 4.2 g/dL (ref 3.5–5.0)
Alkaline Phosphatase: 67 U/L (ref 38–126)
Anion gap: 12 (ref 5–15)
BILIRUBIN TOTAL: 1.2 mg/dL (ref 0.3–1.2)
BUN: 16 mg/dL (ref 6–20)
CHLORIDE: 103 mmol/L (ref 101–111)
CO2: 23 mmol/L (ref 22–32)
CREATININE: 1.31 mg/dL — AB (ref 0.61–1.24)
Calcium: 9.3 mg/dL (ref 8.9–10.3)
Glucose, Bld: 118 mg/dL — ABNORMAL HIGH (ref 65–99)
POTASSIUM: 3.4 mmol/L — AB (ref 3.5–5.1)
Sodium: 138 mmol/L (ref 135–145)
TOTAL PROTEIN: 6.6 g/dL (ref 6.5–8.1)

## 2016-10-06 LAB — URINALYSIS, ROUTINE W REFLEX MICROSCOPIC
BILIRUBIN URINE: NEGATIVE
Glucose, UA: NEGATIVE mg/dL
Hgb urine dipstick: NEGATIVE
KETONES UR: NEGATIVE mg/dL
Leukocytes, UA: NEGATIVE
NITRITE: NEGATIVE
Protein, ur: NEGATIVE mg/dL
Specific Gravity, Urine: 1.029 (ref 1.005–1.030)
pH: 7 (ref 5.0–8.0)

## 2016-10-06 LAB — I-STAT CHEM 8, ED
BUN: 23 mg/dL — AB (ref 6–20)
CREATININE: 1.3 mg/dL — AB (ref 0.61–1.24)
Calcium, Ion: 1.04 mmol/L — ABNORMAL LOW (ref 1.15–1.40)
Chloride: 103 mmol/L (ref 101–111)
GLUCOSE: 118 mg/dL — AB (ref 65–99)
HCT: 44 % (ref 39.0–52.0)
HEMOGLOBIN: 15 g/dL (ref 13.0–17.0)
Potassium: 4.1 mmol/L (ref 3.5–5.1)
Sodium: 137 mmol/L (ref 135–145)
TCO2: 25 mmol/L (ref 0–100)

## 2016-10-06 LAB — BPAM FFP
BLOOD PRODUCT EXPIRATION DATE: 201804152359
Blood Product Expiration Date: 201804152359
ISSUE DATE / TIME: 201804111103
ISSUE DATE / TIME: 201804111103
UNIT TYPE AND RH: 6200
Unit Type and Rh: 6200

## 2016-10-06 LAB — ETHANOL: Alcohol, Ethyl (B): <5 mg/dL (ref <5)

## 2016-10-06 LAB — RAPID URINE DRUG SCREEN, HOSP PERFORMED
Amphetamines: NOT DETECTED
BARBITURATES: NOT DETECTED
BENZODIAZEPINES: NOT DETECTED
Cocaine: NOT DETECTED
OPIATES: NOT DETECTED
Tetrahydrocannabinol: NOT DETECTED

## 2016-10-06 LAB — CBC
HCT: 42.8 % (ref 39.0–52.0)
Hemoglobin: 15.2 g/dL (ref 13.0–17.0)
MCH: 31.4 pg (ref 26.0–34.0)
MCHC: 35.5 g/dL (ref 30.0–36.0)
MCV: 88.4 fL (ref 78.0–100.0)
PLATELETS: 264 10*3/uL (ref 150–400)
RBC: 4.84 MIL/uL (ref 4.22–5.81)
RDW: 12 % (ref 11.5–15.5)
WBC: 9.5 10*3/uL (ref 4.0–10.5)

## 2016-10-06 LAB — PROTIME-INR
INR: 1.14
PROTHROMBIN TIME: 14.7 s (ref 11.4–15.2)

## 2016-10-06 LAB — I-STAT CG4 LACTIC ACID, ED: LACTIC ACID, VENOUS: 1.71 mmol/L (ref 0.5–1.9)

## 2016-10-06 LAB — CDS SEROLOGY

## 2016-10-06 LAB — ABO/RH: ABO/RH(D): B POS

## 2016-10-06 SURGERY — LAPAROTOMY, EXPLORATORY
Anesthesia: General

## 2016-10-06 MED ORDER — OXYCODONE-ACETAMINOPHEN 5-325 MG PO TABS: 1.0000 | ORAL_TABLET | ORAL | 0 refills | Status: DC | PRN

## 2016-10-06 MED ORDER — OXYCODONE HCL 5 MG PO TABS
10.0000 mg | ORAL_TABLET | Freq: Once | ORAL | Status: AC
  Administered 2016-10-06: 10 mg via ORAL
  Filled 2016-10-06: qty 2

## 2016-10-06 MED ORDER — KETOROLAC TROMETHAMINE 30 MG/ML IJ SOLN
30.0000 mg | Freq: Once | INTRAMUSCULAR | Status: AC
  Administered 2016-10-06: 30 mg via INTRAVENOUS
  Filled 2016-10-06: qty 1

## 2016-10-06 MED ORDER — KETOROLAC TROMETHAMINE 10 MG PO TABS: 10.0000 mg | ORAL_TABLET | Freq: Four times a day (QID) | ORAL | 0 refills | Status: DC | PRN

## 2016-10-06 MED ORDER — FENTANYL CITRATE (PF) 100 MCG/2ML IJ SOLN
INTRAMUSCULAR | Status: AC | PRN
  Administered 2016-10-06: 50 ug via INTRAVENOUS

## 2016-10-06 MED ORDER — MIDAZOLAM HCL 2 MG/2ML IJ SOLN
INTRAMUSCULAR | Status: AC
  Filled 2016-10-06: qty 2

## 2016-10-06 MED ORDER — SODIUM CHLORIDE 0.9 % IV SOLN
Freq: Once | INTRAVENOUS | Status: AC
  Administered 2016-10-06: 11:00:00 via INTRAVENOUS

## 2016-10-06 MED ORDER — PROPOFOL 10 MG/ML IV BOLUS
INTRAVENOUS | Status: AC
  Filled 2016-10-06: qty 20

## 2016-10-06 MED ORDER — FENTANYL CITRATE (PF) 250 MCG/5ML IJ SOLN
INTRAMUSCULAR | Status: AC
  Filled 2016-10-06: qty 5

## 2016-10-06 MED ORDER — IOPAMIDOL (ISOVUE-300) INJECTION 61%
INTRAVENOUS | Status: AC
  Filled 2016-10-06: qty 100

## 2016-10-06 MED ORDER — FENTANYL CITRATE (PF) 100 MCG/2ML IJ SOLN
INTRAMUSCULAR | Status: AC
  Filled 2016-10-06: qty 2

## 2016-10-06 MED ORDER — FENTANYL CITRATE (PF) 100 MCG/2ML IJ SOLN
50.0000 ug | Freq: Once | INTRAMUSCULAR | Status: AC
  Administered 2016-10-06: 50 ug via INTRAVENOUS
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
## 2016-10-06 NOTE — ED Notes (Signed)
Taking pt to CT

## 2016-10-06 NOTE — Anesthesia Preprocedure Evaluation (Deleted)
Anesthesia Evaluation  Patient identified by MRN, date of birth, ID band Patient awake    Reviewed: Allergy & Precautions, H&P , NPO status , Patient's Chart, lab work & pertinent test results  Airway        Dental no notable dental hx.    Pulmonary neg pulmonary ROS,    Pulmonary exam normal        Cardiovascular negative cardio ROS       Neuro/Psych negative neurological ROS  negative psych ROS   GI/Hepatic negative GI ROS, Neg liver ROS,   Endo/Other  negative endocrine ROS  Renal/GU negative Renal ROS  negative genitourinary   Musculoskeletal   Abdominal   Peds  Hematology negative hematology ROS (+)   Anesthesia Other Findings   Reproductive/Obstetrics negative OB ROS                             Anesthesia Physical Anesthesia Plan  ASA: I and emergent  Anesthesia Plan: General   Post-op Pain Management:    Induction: Intravenous, Rapid sequence and Cricoid pressure planned  Airway Management Planned: Oral ETT  Additional Equipment:   Intra-op Plan:   Post-operative Plan: Extubation in OR and Possible Post-op intubation/ventilation  Informed Consent: I have reviewed the patients History and Physical, chart, labs and discussed the procedure including the risks, benefits and alternatives for the proposed anesthesia with the patient or authorized representative who has indicated his/her understanding and acceptance.   Dental advisory given  Plan Discussed with: CRNA  Anesthesia Plan Comments:         Anesthesia Quick Evaluation
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
## 2016-10-06 NOTE — ED Notes (Signed)
Pt stated will attempt to give urine sample. Pt given something to drink.
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
## 2016-10-06 NOTE — ED Notes (Signed)
Dr. Hulen Skains reports pt can eat and drink. Given orange juice and crackers.
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
## 2016-10-07 LAB — BPAM RBC
BLOOD PRODUCT EXPIRATION DATE: 201805032359
Blood Product Expiration Date: 201804302359
ISSUE DATE / TIME: 201804112021
ISSUE DATE / TIME: 201804112021
UNIT TYPE AND RH: 9500
Unit Type and Rh: 9500

## 2016-10-07 LAB — TYPE AND SCREEN
ABO/RH(D): B POS
Antibody Screen: NEGATIVE
UNIT DIVISION: 0
UNIT DIVISION: 0

## 2016-11-17 ENCOUNTER — Ambulatory Visit (INDEPENDENT_AMBULATORY_CARE_PROVIDER_SITE_OTHER): Payer: Worker's Compensation | Admitting: Emergency Medicine

## 2016-11-17 VITALS — BP 127/89 | HR 72 | Temp 98.6°F | Resp 18 | Ht 75.5 in | Wt 203.0 lb

## 2016-11-17 DIAGNOSIS — S301XXA Contusion of abdominal wall, initial encounter: Secondary | ICD-10-CM

## 2016-11-17 DIAGNOSIS — R109 Unspecified abdominal pain: Secondary | ICD-10-CM | POA: Diagnosis not present

## 2016-11-17 DIAGNOSIS — W3400XD Accidental discharge from unspecified firearms or gun, subsequent encounter: Secondary | ICD-10-CM

## 2016-11-17 NOTE — Patient Instructions (Signed)
     IF you received an x-ray today, you will receive an invoice from [facility]. Please contact [facility] at [phone] with questions or concerns regarding your invoice.   IF you received labwork today, you will receive an invoice from [facility]. Please contact [facility] at [phone] with questions or concerns regarding your invoice.   Our billing staff will not be able to assist you with questions regarding bills from these companies.  You will be contacted with the lab results as soon as they are available. The fastest way to get your results is to activate your My Chart account. Instructions are located on the last page of this paperwork. If you have not heard from us regarding the results in 2 weeks, please contact this office.     
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
## 2016-11-18 ENCOUNTER — Encounter: Payer: Self-pay | Admitting: Radiology

## 2016-11-18 LAB — COMPREHENSIVE METABOLIC PANEL
ALT: 25 IU/L (ref 0–44)
AST: 24 IU/L (ref 0–40)
Albumin/Globulin Ratio: 1.9 (ref 1.2–2.2)
Albumin: 4.7 g/dL (ref 3.5–5.5)
Alkaline Phosphatase: 82 IU/L (ref 39–117)
BUN/Creatinine Ratio: 14 (ref 9–20)
BUN: 17 mg/dL (ref 6–24)
Bilirubin Total: 0.5 mg/dL (ref 0.0–1.2)
CALCIUM: 9.9 mg/dL (ref 8.7–10.2)
CO2: 25 mmol/L (ref 18–29)
Chloride: 102 mmol/L (ref 96–106)
Creatinine, Ser: 1.25 mg/dL (ref 0.76–1.27)
GFR, EST AFRICAN AMERICAN: 77 mL/min/{1.73_m2} (ref >59)
GFR, EST NON AFRICAN AMERICAN: 66 mL/min/{1.73_m2} (ref >59)
GLUCOSE: 90 mg/dL (ref 65–99)
Globulin, Total: 2.5 g/dL (ref 1.5–4.5)
POTASSIUM: 5 mmol/L (ref 3.5–5.2)
SODIUM: 141 mmol/L (ref 134–144)
TOTAL PROTEIN: 7.2 g/dL (ref 6.0–8.5)

## 2016-11-18 LAB — CBC WITH DIFFERENTIAL/PLATELET
BASOS ABS: 0 10*3/uL (ref 0.0–0.2)
Basos: 0 %
EOS (ABSOLUTE): 0.2 10*3/uL (ref 0.0–0.4)
Eos: 3 %
HEMOGLOBIN: 15.5 g/dL (ref 13.0–17.7)
Hematocrit: 44.4 % (ref 37.5–51.0)
IMMATURE GRANS (ABS): 0 10*3/uL (ref 0.0–0.1)
IMMATURE GRANULOCYTES: 0 %
LYMPHS: 30 %
Lymphocytes Absolute: 2.2 10*3/uL (ref 0.7–3.1)
MCH: 31.3 pg (ref 26.6–33.0)
MCHC: 34.9 g/dL (ref 31.5–35.7)
MCV: 90 fL (ref 79–97)
MONOCYTES: 8 %
Monocytes Absolute: 0.6 10*3/uL (ref 0.1–0.9)
NEUTROS PCT: 59 %
Neutrophils Absolute: 4.2 10*3/uL (ref 1.4–7.0)
PLATELETS: 296 10*3/uL (ref 150–379)
RBC: 4.95 x10E6/uL (ref 4.14–5.80)
RDW: 13.2 % (ref 12.3–15.4)
WBC: 7.2 10*3/uL (ref 3.4–10.8)

## 2016-11-25 ENCOUNTER — Ambulatory Visit: Payer: Self-pay | Admitting: Family Medicine

## 2016-12-01 ENCOUNTER — Ambulatory Visit (INDEPENDENT_AMBULATORY_CARE_PROVIDER_SITE_OTHER): Payer: Worker's Compensation | Admitting: Emergency Medicine

## 2016-12-01 VITALS — BP 122/83 | HR 67 | Temp 99.0°F | Resp 16 | Ht 74.8 in | Wt 207.0 lb

## 2016-12-01 DIAGNOSIS — W3400XD Accidental discharge from unspecified firearms or gun, subsequent encounter: Secondary | ICD-10-CM | POA: Diagnosis not present

## 2016-12-01 DIAGNOSIS — R109 Unspecified abdominal pain: Secondary | ICD-10-CM | POA: Diagnosis not present

## 2016-12-01 DIAGNOSIS — S301XXA Contusion of abdominal wall, initial encounter: Secondary | ICD-10-CM
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
## 2016-12-01 NOTE — Patient Instructions (Addendum)
Continues to improve. Continue light duties. F/U in 2 weeks.    IF you received an x-ray today, you will receive an invoice from [facility]. Please contact [facility] at [phone] with questions or concerns regarding your invoice.   IF you received labwork today, you will receive an invoice from [facility]. Please contact [facility] at [phone] with questions or concerns regarding your invoice.   Our billing staff will not be able to assist you with questions regarding bills from these companies.  You will be contacted with the lab results as soon as they are available. The fastest way to get your results is to activate your My Chart account. Instructions are located on the last page of this paperwork. If you have not heard from [place] regarding the results in 2 weeks, please contact this office.

## 2016-12-16 ENCOUNTER — Ambulatory Visit (INDEPENDENT_AMBULATORY_CARE_PROVIDER_SITE_OTHER): Payer: Worker's Compensation | Admitting: Emergency Medicine

## 2016-12-16 VITALS — BP 133/83 | HR 86 | Temp 98.6°F | Resp 16 | Ht 74.8 in | Wt 206.8 lb

## 2016-12-16 DIAGNOSIS — W3400XD Accidental discharge from unspecified firearms or gun, subsequent encounter: Secondary | ICD-10-CM | POA: Diagnosis not present

## 2016-12-16 DIAGNOSIS — S301XXA Contusion of abdominal wall, initial encounter: Secondary | ICD-10-CM | POA: Insufficient documentation

## 2016-12-16 DIAGNOSIS — R109 Unspecified abdominal pain: Secondary | ICD-10-CM

## 2016-12-16 DIAGNOSIS — S301XXD Contusion of abdominal wall, subsequent encounter: Secondary | ICD-10-CM
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
## 2017-01-14 ENCOUNTER — Other Ambulatory Visit: Payer: Self-pay | Admitting: Emergency Medicine

## 2017-01-17 ENCOUNTER — Encounter: Payer: Self-pay | Admitting: Emergency Medicine

## 2017-01-17 ENCOUNTER — Ambulatory Visit (INDEPENDENT_AMBULATORY_CARE_PROVIDER_SITE_OTHER): Payer: Worker's Compensation | Admitting: Emergency Medicine

## 2017-01-17 VITALS — BP 146/82 | HR 62 | Temp 98.0°F | Resp 18 | Ht 75.2 in | Wt 204.0 lb

## 2017-01-17 DIAGNOSIS — W3400XD Accidental discharge from unspecified firearms or gun, subsequent encounter: Secondary | ICD-10-CM

## 2017-01-17 DIAGNOSIS — S301XXD Contusion of abdominal wall, subsequent encounter: Secondary | ICD-10-CM
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
## 2017-01-17 NOTE — Patient Instructions (Signed)
     IF you received an x-ray today, you will receive an invoice from [facility]. Please contact [facility] at [phone] with questions or concerns regarding your invoice.   IF you received labwork today, you will receive an invoice from [facility]. Please contact [facility] at [phone] with questions or concerns regarding your invoice.   Our billing staff will not be able to assist you with questions regarding bills from these companies.  You will be contacted with the lab results as soon as they are available. The fastest way to get your results is to activate your My Chart account. Instructions are located on the last page of this paperwork. If you have not heard from us regarding the results in 2 weeks, please contact this office.     

## 2017-02-15 ENCOUNTER — Encounter: Payer: Self-pay | Admitting: Emergency Medicine

## 2017-02-15 DIAGNOSIS — M62830 Muscle spasm of back: Secondary | ICD-10-CM | POA: Diagnosis not present

## 2017-02-15 DIAGNOSIS — M545 Low back pain: Secondary | ICD-10-CM | POA: Diagnosis not present

## 2017-02-15 NOTE — ED Triage Notes (Signed)
Pt c/o lower back pain that started this AM while pt was out jogging. Pt sts his lower back has since increased in pain with 2 oxycodone and 1 muscle relaxer given at home. Pt last took oxycodone at 2030.

## 2017-02-16 ENCOUNTER — Emergency Department
Admission: EM | Admit: 2017-02-16 | Discharge: 2017-02-16 | Disposition: A | Discharge status: Home / Self Care | Payer: Worker's Compensation | Attending: Emergency Medicine | Admitting: Emergency Medicine

## 2017-02-16 DIAGNOSIS — M545 Low back pain, unspecified: Secondary | ICD-10-CM

## 2017-02-16 DIAGNOSIS — M62838 Other muscle spasm: Secondary | ICD-10-CM

## 2017-02-16 MED ORDER — SODIUM CHLORIDE 0.9 % IV BOLUS (SEPSIS)
1000.0000 mL | Freq: Once | INTRAVENOUS | Status: AC
  Administered 2017-02-16: 1000 mL via INTRAVENOUS

## 2017-02-16 MED ORDER — DICLOFENAC SODIUM 3 % TD GEL
1.0000 | Freq: Two times a day (BID) | TRANSDERMAL | 0 refills | Status: DC | PRN
Start: 2017-02-16 — End: 2017-11-17

## 2017-02-16 MED ORDER — KETOROLAC TROMETHAMINE 30 MG/ML IJ SOLN
30.0000 mg | Freq: Once | INTRAMUSCULAR | Status: AC
  Administered 2017-02-16: 30 mg via INTRAVENOUS
  Filled 2017-02-16: qty 1

## 2017-02-16 MED ORDER — MORPHINE SULFATE (PF) 4 MG/ML IV SOLN
4.0000 mg | Freq: Once | INTRAVENOUS | Status: AC
  Administered 2017-02-16: 4 mg via INTRAVENOUS
  Filled 2017-02-16: qty 1

## 2017-02-16 MED ORDER — ONDANSETRON HCL 4 MG/2ML IJ SOLN
4.0000 mg | Freq: Once | INTRAMUSCULAR | Status: AC
  Administered 2017-02-16: 4 mg via INTRAVENOUS
  Filled 2017-02-16: qty 2

## 2017-02-16 NOTE — ED Notes (Signed)
Pt states low back pain since this afternoon. Pt points to low lumbar spine as painful area. Pt states pain is worse with movement. Pt denies loss of bowel or bladder, known hematuria. Pt states he does have shortness of breath when attempting to move due to pain. resps unlabored.

## 2017-02-16 NOTE — ED Notes (Signed)
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
## 2017-02-18 ENCOUNTER — Other Ambulatory Visit: Payer: Self-pay | Admitting: Emergency Medicine

## 2017-02-24 ENCOUNTER — Encounter: Payer: Self-pay | Admitting: Emergency Medicine

## 2017-02-24 ENCOUNTER — Ambulatory Visit (INDEPENDENT_AMBULATORY_CARE_PROVIDER_SITE_OTHER): Payer: Worker's Compensation | Admitting: Emergency Medicine

## 2017-02-24 VITALS — BP 114/76 | HR 74 | Temp 98.9°F | Resp 16 | Ht 75.0 in | Wt 201.2 lb

## 2017-02-24 DIAGNOSIS — W3400XD Accidental discharge from unspecified firearms or gun, subsequent encounter: Secondary | ICD-10-CM

## 2017-02-24 DIAGNOSIS — R109 Unspecified abdominal pain: Secondary | ICD-10-CM | POA: Diagnosis not present

## 2017-02-24 DIAGNOSIS — S301XXD Contusion of abdominal wall, subsequent encounter: Secondary | ICD-10-CM

## 2017-02-24 DIAGNOSIS — S39012D Strain of muscle, fascia and tendon of lower back, subsequent encounter: Secondary | ICD-10-CM | POA: Insufficient documentation

## 2017-02-24 NOTE — Patient Instructions (Addendum)
IF you received an x-ray today, you will receive an invoice from [facility]. Please contact [facility] at [phone] with questions or concerns regarding your invoice.   IF you received labwork today, you will receive an invoice from [facility]. Please contact [facility] at [phone] with questions or concerns regarding your invoice.   Our billing staff will not be able to assist you with questions regarding bills from these companies.  You will be contacted with the lab results as soon as they are available. The fastest way to get your results is to activate your My Chart account. Instructions are located on the last page of this paperwork. If you have not heard from [place] regarding the results in 2 weeks, please contact this office.     Low Back Sprain A sprain is a stretch or tear in the bands of tissue that hold bones and joints together (ligaments). Sprains of the lower back (lumbar spine) are a common cause of low back pain. A sprain occurs when ligaments are overextended or stretched beyond their limits. The ligaments can become inflamed, resulting in pain and sudden muscle tightening (spasms). A sprain can be caused by an injury (trauma), or it can develop gradually due to overuse. There are three types of sprains:  Grade 1 is a mild sprain involving an overstretched ligament or a very slight tear of the ligament.  Grade 2 is a moderate sprain involving a partial tear of the ligament.  Grade 3 is a severe sprain involving a complete tear of the ligament.  What are the causes? This condition may be caused by:  Trauma, such as a fall or a hit to the body.  Twisting or overstretching the back. This may result from doing activities that require a lot of energy, such as lifting heavy objects.  What increases the risk? The following factors may increase your risk of getting this condition:  Playing contact sports.  Participating in sports or activities that  put excessive stress on the back and require a lot of bending and twisting, including: ? Lifting weights or heavy objects. ? Gymnastics. ? Soccer. ? Figure skating. ? Snowboarding.  Being overweight or obese.  Having poor strength and flexibility.  What are the signs or symptoms? Symptoms of this condition may include:  Sharp or dull pain in the lower back that does not go away. Pain may extend to the buttocks.  Stiffness.  Limited range of motion.  Inability to stand up straight due to stiffness or pain.  Muscle spasms.  How is this diagnosed?  This condition may be diagnosed based on:  Your symptoms.  Your medical history.  A physical exam. ? Your health care provider may push on certain areas of your back to determine the source of your pain. ? You may be asked to bend forward, backward, and side to side to assess the severity of your pain and your range of motion.  Imaging tests, such as: ? X-rays. ? MRI.  How is this treated? Treatment for this condition may include:  Applying heat and cold to the affected area.  Medicines to help relieve pain and to relax your muscles (muscle relaxants).  NSAIDs to help reduce swelling and discomfort.  Physical therapy.  When your symptoms improve, it is important to gradually return to your normal routine as soon as possible to reduce pain, avoid stiffness, and avoid loss of muscle strength. Generally, symptoms should improve within 6 weeks of treatment. However, recovery time  varies. Follow these instructions at home: Managing pain, stiffness, and swelling  If directed, apply ice to the injured area during the first 24 hours after your injury. ? Put ice in a plastic bag. ? Place a towel between your skin and the bag. ? Leave the ice on for 20 minutes, 2-3 times a day.  If directed, apply heat to the affected area as often as told by your health care provider. Use the heat source that your health care provider  recommends, such as a moist heat pack or a heating pad. ? Place a towel between your skin and the heat source. ? Leave the heat on for 20-30 minutes. ? Remove the heat if your skin turns bright red. This is especially important if you are unable to feel pain, heat, or cold. You may have a greater risk of getting burned. Activity  Rest and return to your normal activities as told by your health care provider. Ask your health care provider what activities are safe for you.  Avoid activities that take a lot of effort (are strenuous) for as long as told by your health care provider.  Do exercises as told by your health care provider. General instructions   Take over-the-counter and prescription medicines only as told by your health care provider.  If you have questions or concerns about safety while taking pain medicine, talk with your health care provider.  Do not drive or operate heavy machinery until you know how your pain medicine affects you.  Do not use any tobacco products, such as cigarettes, chewing tobacco, and e-cigarettes. Tobacco can delay bone healing. If you need help quitting, ask your health care provider.  Keep all follow-up visits as told by your health care provider. This is important. How is this prevented?  Warm up and stretch before being active.  Cool down and stretch after being active.  Give your body time to rest between periods of activity.  Avoid: ? Being physically inactive for long periods at a time. ? Exercising or playing sports when you are tired or in pain.  Use correct form when playing sports and lifting heavy objects.  Use good posture when sitting and standing.  Maintain a healthy weight.  Sleep on a mattress with medium firmness to support your back.  Make sure to use equipment that fits you, including shoes that fit well.  Be safe and responsible while being active to avoid falls.  Do at least 150 minutes of moderate-intensity  exercise each week, such as brisk walking or water aerobics. Try a form of exercise that takes stress off your back, such as swimming or stationary cycling.  Maintain physical fitness, including: ? Strength. In particular, develop and maintain strong abdominal muscles. ? Flexibility. ? Cardiovascular fitness. ? Endurance. Contact a health care provider if:  Your back pain does not improve after 6 weeks of treatment.  Your symptoms get worse. Get help right away if:  Your back pain is severe.  You are unable to stand or walk.  You develop pain in your legs.  You develop weakness in your buttocks or legs.  You have difficulty controlling when you urinate or when you have a bowel movement. This information is not intended to replace advice given to you by your health care provider. Make sure you discuss any questions you have with your health care provider. Document Released: 06/14/2005 Document Revised: 02/19/2016 Document Reviewed: 03/26/2015 Elsevier Interactive Patient Education  [employer].
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
## 2017-03-08 ENCOUNTER — Encounter: Payer: Self-pay | Admitting: Emergency Medicine

## 2017-03-08 ENCOUNTER — Ambulatory Visit (INDEPENDENT_AMBULATORY_CARE_PROVIDER_SITE_OTHER): Payer: Worker's Compensation | Admitting: Emergency Medicine

## 2017-03-08 VITALS — BP 117/74 | HR 76 | Temp 98.2°F | Resp 17 | Ht 75.0 in | Wt 204.0 lb

## 2017-03-08 DIAGNOSIS — S301XXD Contusion of abdominal wall, subsequent encounter: Secondary | ICD-10-CM | POA: Diagnosis not present

## 2017-03-08 DIAGNOSIS — W3400XD Accidental discharge from unspecified firearms or gun, subsequent encounter: Secondary | ICD-10-CM

## 2017-03-08 DIAGNOSIS — S39012D Strain of muscle, fascia and tendon of lower back, subsequent encounter: Secondary | ICD-10-CM

## 2017-03-08 NOTE — Assessment & Plan Note (Signed)
resolved 

## 2017-03-08 NOTE — Patient Instructions (Addendum)
     IF you received an x-ray today, you will receive an invoice from [facility]. Please contact [facility] at [phone] with questions or concerns regarding your invoice.   IF you received labwork today, you will receive an invoice from [facility]. Please contact [facility] at [phone] with questions or concerns regarding your invoice.   Our billing staff will not be able to assist you with questions regarding bills from these companies.  You will be contacted with the lab results as soon as they are available. The fastest way to get your results is to activate your My Chart account. Instructions are located on the last page of this paperwork. If you have not heard from us regarding the results in 2 weeks, please contact this office.      Back Pain, Adult Back pain is very common. The pain often gets better over time. The cause of back pain is usually not dangerous. Most people can learn to manage their back pain on their own. Follow these instructions at home: Watch your back pain for any changes. The following actions may help to lessen any pain you are feeling:  Stay active. Start with short walks on flat ground if you can. Try to walk farther each day.  Exercise regularly as told by your doctor. Exercise helps your back heal faster. It also helps avoid future injury by keeping your muscles strong and flexible.  Do not sit, drive, or stand in one place for more than 30 minutes.  Do not stay in bed. Resting more than 1-2 days can slow down your recovery.  Be careful when you bend or lift an object. Use good form when lifting: ? Bend at your knees. ? Keep the object close to your body. ? Do not twist.  Sleep on a firm mattress. Lie on your side, and bend your knees. If you lie on your back, put a pillow under your knees.  Take medicines only as told by your doctor.  Put ice on the injured area. ? Put ice in a plastic bag. ? Place a towel between your  skin and the bag. ? Leave the ice on for 20 minutes, 2-3 times a day for the first 2-3 days. After that, you can switch between ice and heat packs.  Avoid feeling anxious or stressed. Find good ways to deal with stress, such as exercise.  Maintain a healthy weight. Extra weight puts stress on your back.  Contact a doctor if:  You have pain that does not go away with rest or medicine.  You have worsening pain that goes down into your legs or buttocks.  You have pain that does not get better in one week.  You have pain at night.  You lose weight.  You have a fever or chills. Get help right away if:  You cannot control when you poop (bowel movement) or pee (urinate).  Your arms or legs feel weak.  Your arms or legs lose feeling (numbness).  You feel sick to your stomach (nauseous) or throw up (vomit).  You have belly (abdominal) pain.  You feel like you may pass out (faint). This information is not intended to replace advice given to you by your health care provider. Make sure you discuss any questions you have with your health care provider. Document Released: 12/01/2007 Document Revised: 11/20/2015 Document Reviewed: 10/16/2013 Elsevier Interactive Patient Education  2018 Elsevier Inc.  
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
## 2017-04-07 DIAGNOSIS — Z0000 Encounter for general adult medical examination without abnormal findings: Secondary | ICD-10-CM | POA: Diagnosis not present

## 2017-04-07 DIAGNOSIS — Z13220 Encounter for screening for lipoid disorders: Secondary | ICD-10-CM | POA: Diagnosis not present

## 2017-04-08 ENCOUNTER — Ambulatory Visit: Payer: Self-pay | Admitting: Emergency Medicine

## 2017-04-12 ENCOUNTER — Encounter: Payer: Self-pay | Admitting: Emergency Medicine

## 2017-04-12 ENCOUNTER — Ambulatory Visit (INDEPENDENT_AMBULATORY_CARE_PROVIDER_SITE_OTHER): Payer: Worker's Compensation | Admitting: Emergency Medicine

## 2017-04-12 VITALS — BP 116/78 | HR 73 | Temp 98.5°F | Resp 16 | Ht 73.0 in | Wt 199.6 lb

## 2017-04-12 DIAGNOSIS — S39012D Strain of muscle, fascia and tendon of lower back, subsequent encounter: Secondary | ICD-10-CM | POA: Diagnosis not present

## 2017-04-12 DIAGNOSIS — S301XXD Contusion of abdominal wall, subsequent encounter: Secondary | ICD-10-CM

## 2017-04-12 DIAGNOSIS — W3400XD Accidental discharge from unspecified firearms or gun, subsequent encounter: Secondary | ICD-10-CM
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
## 2017-04-12 NOTE — Assessment & Plan Note (Signed)
-  Fully healed. 

## 2017-04-12 NOTE — Patient Instructions (Addendum)
     IF you received an x-ray today, you will receive an invoice from [facility]. Please contact [facility] at [phone] with questions or concerns regarding your invoice.   IF you received labwork today, you will receive an invoice from [facility]. Please contact [facility] at [phone] with questions or concerns regarding your invoice.   Our billing staff will not be able to assist you with questions regarding bills from these companies.  You will be contacted with the lab results as soon as they are available. The fastest way to get your results is to activate your My Chart account. Instructions are located on the last page of this paperwork. If you have not heard from us regarding the results in 2 weeks, please contact this office.      Back Pain, Adult Back pain is very common. The pain often gets better over time. The cause of back pain is usually not dangerous. Most people can learn to manage their back pain on their own. Follow these instructions at home: Watch your back pain for any changes. The following actions may help to lessen any pain you are feeling:  Stay active. Start with short walks on flat ground if you can. Try to walk farther each day.  Exercise regularly as told by your doctor. Exercise helps your back heal faster. It also helps avoid future injury by keeping your muscles strong and flexible.  Do not sit, drive, or stand in one place for more than 30 minutes.  Do not stay in bed. Resting more than 1-2 days can slow down your recovery.  Be careful when you bend or lift an object. Use good form when lifting: ? Bend at your knees. ? Keep the object close to your body. ? Do not twist.  Sleep on a firm mattress. Lie on your side, and bend your knees. If you lie on your back, put a pillow under your knees.  Take medicines only as told by your doctor.  Put ice on the injured area. ? Put ice in a plastic bag. ? Place a towel between your  skin and the bag. ? Leave the ice on for 20 minutes, 2-3 times a day for the first 2-3 days. After that, you can switch between ice and heat packs.  Avoid feeling anxious or stressed. Find good ways to deal with stress, such as exercise.  Maintain a healthy weight. Extra weight puts stress on your back.  Contact a doctor if:  You have pain that does not go away with rest or medicine.  You have worsening pain that goes down into your legs or buttocks.  You have pain that does not get better in one week.  You have pain at night.  You lose weight.  You have a fever or chills. Get help right away if:  You cannot control when you poop (bowel movement) or pee (urinate).  Your arms or legs feel weak.  Your arms or legs lose feeling (numbness).  You feel sick to your stomach (nauseous) or throw up (vomit).  You have belly (abdominal) pain.  You feel like you may pass out (faint). This information is not intended to replace advice given to you by your health care provider. Make sure you discuss any questions you have with your health care provider. Document Released: 12/01/2007 Document Revised: 11/20/2015 Document Reviewed: 10/16/2013 Elsevier Interactive Patient Education  2018 Elsevier Inc.  

## 2017-04-12 NOTE — Assessment & Plan Note (Signed)
Still bothering him; only 50 % better; will request Ortho consult and L-S spine MRI.

## 2017-04-19 ENCOUNTER — Ambulatory Visit
Admission: RE | Admit: 2017-04-19 | Discharge: 2017-04-19 | Disposition: A | Discharge status: Home / Self Care | Payer: Worker's Compensation | Source: Ambulatory Visit | Attending: Occupational Medicine | Admitting: Occupational Medicine

## 2017-04-19 ENCOUNTER — Other Ambulatory Visit: Payer: Self-pay | Admitting: Occupational Medicine

## 2017-04-19 DIAGNOSIS — Z0289 Encounter for other administrative examinations: Secondary | ICD-10-CM

## 2017-05-16 DIAGNOSIS — Z1211 Encounter for screening for malignant neoplasm of colon: Secondary | ICD-10-CM | POA: Diagnosis not present

## 2017-05-16 DIAGNOSIS — R0602 Shortness of breath: Secondary | ICD-10-CM | POA: Diagnosis not present

## 2017-05-16 DIAGNOSIS — R109 Unspecified abdominal pain: Secondary | ICD-10-CM | POA: Diagnosis not present

## 2017-05-23 DIAGNOSIS — M25332 Other instability, left wrist: Secondary | ICD-10-CM | POA: Insufficient documentation

## 2017-05-23 DIAGNOSIS — G5602 Carpal tunnel syndrome, left upper limb: Secondary | ICD-10-CM | POA: Insufficient documentation

## 2017-09-21 DIAGNOSIS — R1084 Generalized abdominal pain: Secondary | ICD-10-CM | POA: Diagnosis not present

## 2017-09-21 DIAGNOSIS — R6881 Early satiety: Secondary | ICD-10-CM | POA: Diagnosis not present

## 2017-09-21 DIAGNOSIS — K219 Gastro-esophageal reflux disease without esophagitis: Secondary | ICD-10-CM | POA: Diagnosis not present

## 2017-11-17 ENCOUNTER — Ambulatory Visit (INDEPENDENT_AMBULATORY_CARE_PROVIDER_SITE_OTHER): Payer: Worker's Compensation | Admitting: Internal Medicine

## 2017-11-17 ENCOUNTER — Other Ambulatory Visit (INDEPENDENT_AMBULATORY_CARE_PROVIDER_SITE_OTHER): Payer: Worker's Compensation

## 2017-11-17 ENCOUNTER — Encounter: Payer: Self-pay | Admitting: Internal Medicine

## 2017-11-17 VITALS — BP 128/84 | HR 85 | Ht 76.0 in | Wt 198.0 lb

## 2017-11-17 DIAGNOSIS — R109 Unspecified abdominal pain: Secondary | ICD-10-CM

## 2017-11-17 DIAGNOSIS — R0600 Dyspnea, unspecified: Secondary | ICD-10-CM

## 2017-11-17 DIAGNOSIS — R0609 Other forms of dyspnea: Secondary | ICD-10-CM | POA: Diagnosis not present

## 2017-11-17 LAB — CBC WITH DIFFERENTIAL/PLATELET
BASOS ABS: 0 10*3/uL (ref 0.0–0.1)
Basophils Relative: 0.6 % (ref 0.0–3.0)
EOS ABS: 0.1 10*3/uL (ref 0.0–0.7)
Eosinophils Relative: 1.1 % (ref 0.0–5.0)
HEMATOCRIT: 46.3 % (ref 39.0–52.0)
Hemoglobin: 16.3 g/dL (ref 13.0–17.0)
LYMPHS PCT: 22.3 % (ref 12.0–46.0)
Lymphs Abs: 1.9 10*3/uL (ref 0.7–4.0)
MCHC: 35.1 g/dL (ref 30.0–36.0)
MCV: 91.5 fl (ref 78.0–100.0)
MONOS PCT: 8.6 % (ref 3.0–12.0)
Monocytes Absolute: 0.7 10*3/uL (ref 0.1–1.0)
NEUTROS PCT: 67.4 % (ref 43.0–77.0)
Neutro Abs: 5.6 10*3/uL (ref 1.4–7.7)
Platelets: 326 10*3/uL (ref 150.0–400.0)
RBC: 5.07 Mil/uL (ref 4.22–5.81)
RDW: 12.3 % (ref 11.5–15.5)
WBC: 8.3 10*3/uL (ref 4.0–10.5)

## 2017-11-17 LAB — BASIC METABOLIC PANEL
BUN: 17 mg/dL (ref 6–23)
CHLORIDE: 102 meq/L (ref 96–112)
CO2: 31 mEq/L (ref 19–32)
Calcium: 10.2 mg/dL (ref 8.4–10.5)
Creatinine, Ser: 1.38 mg/dL (ref 0.40–1.50)
GFR: 57.47 mL/min — AB (ref >60.00)
Glucose, Bld: 88 mg/dL (ref 70–99)
POTASSIUM: 5.2 meq/L — AB (ref 3.5–5.1)
SODIUM: 138 meq/L (ref 135–145)

## 2017-11-17 LAB — BRAIN NATRIURETIC PEPTIDE: Pro B Natriuretic peptide (BNP): 35 pg/mL (ref 0.0–100.0)

## 2017-11-17 LAB — TSH: TSH: 0.98 u[IU]/mL (ref 0.35–4.50)

## 2017-11-17 LAB — SEDIMENTATION RATE: Sed Rate: 1 mm/hr (ref 0–20)

## 2017-11-17 NOTE — Patient Instructions (Addendum)
GERD (REFLUX)  is an extremely common cause of respiratory symptoms just like yours , many times with no obvious heartburn at all.    It can be treated with medication, but also with lifestyle changes including elevation of the head of your bed (ideally with 6 inch  bed blocks),  Smoking cessation, avoidance of late meals, excessive alcohol, and avoid fatty foods, chocolate, peppermint, colas, red wine, and acidic juices such as orange juice.  NO MINT OR MENTHOL PRODUCTS SO NO COUGH DROPS  USE SUGARLESS CANDY INSTEAD (Jolley ranchers or Stover's or Life Savers) or even ice chips will also do - the key is to swallow to prevent all throat clearing. NO OIL BASED VITAMINS - use powdered substitutes.   Schedule pfts next available   Zostrix cream applied 4 x daily x 2 weeks and if better wean to bedtime use    Please remember to go to the lab department downstairs in the basement  for your tests - we will call you with the results when they are available.       If not improving the next step is a cpst - call me to schedule but I would recommend for the test you take omeprazole daily Take 30-60 min before first meal of the day including the day of the test
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
## 2017-11-18 ENCOUNTER — Encounter: Payer: Self-pay | Admitting: Internal Medicine
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
## 2017-11-18 NOTE — Progress Notes (Signed)
Spoke with pt and notified of results per Dr. Wert. Pt verbalized understanding and denied any questions. 
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
## 2017-11-29 ENCOUNTER — Ambulatory Visit (INDEPENDENT_AMBULATORY_CARE_PROVIDER_SITE_OTHER): Payer: Worker's Compensation | Admitting: Internal Medicine

## 2017-11-29 DIAGNOSIS — R0609 Other forms of dyspnea: Secondary | ICD-10-CM | POA: Diagnosis not present

## 2017-11-29 DIAGNOSIS — R0600 Dyspnea, unspecified: Secondary | ICD-10-CM

## 2017-11-29 LAB — PULMONARY FUNCTION TEST
DL/VA % pred: 90 %
DL/VA: 4.46 ml/min/mmHg/L
DLCO COR: 31.15 ml/min/mmHg
DLCO UNC % PRED: 80 %
DLCO cor % pred: 77 %
DLCO unc: 32.54 ml/min/mmHg
FEF 25-75 POST: 3.51 L/s
FEF 25-75 Pre: 1.98 L/sec
FEF2575-%Change-Post: 77 %
FEF2575-%PRED-PRE: 50 %
FEF2575-%Pred-Post: 88 %
FEV1-%Change-Post: 19 %
FEV1-%PRED-PRE: 67 %
FEV1-%Pred-Post: 80 %
FEV1-POST: 3.76 L
FEV1-Pre: 3.14 L
FEV1FVC-%CHANGE-POST: 8 %
FEV1FVC-%PRED-PRE: 87 %
FEV6-%CHANGE-POST: 10 %
FEV6-%PRED-PRE: 78 %
FEV6-%Pred-Post: 86 %
FEV6-Post: 5.08 L
FEV6-Pre: 4.6 L
FEV6FVC-%Change-Post: 0 %
FEV6FVC-%PRED-POST: 102 %
FEV6FVC-%Pred-Pre: 102 %
FVC-%Change-Post: 10 %
FVC-%Pred-Post: 85 %
FVC-%Pred-Pre: 77 %
FVC-Post: 5.15 L
FVC-Pre: 4.67 L
POST FEV6/FVC RATIO: 100 %
Post FEV1/FVC ratio: 73 %
Pre FEV1/FVC ratio: 67 %
Pre FEV6/FVC Ratio: 99 %
RV % pred: 131 %
RV: 3.17 L
TLC % PRED: 100 %
TLC: 8.21 L

## 2017-11-29 NOTE — Progress Notes (Signed)
PFT done today. 

## 2017-12-15 ENCOUNTER — Encounter: Payer: Self-pay | Admitting: Internal Medicine

## 2017-12-15 ENCOUNTER — Ambulatory Visit (INDEPENDENT_AMBULATORY_CARE_PROVIDER_SITE_OTHER): Payer: Worker's Compensation | Admitting: Internal Medicine

## 2017-12-15 VITALS — BP 120/78 | HR 80 | Ht 76.0 in | Wt 204.8 lb

## 2017-12-15 DIAGNOSIS — J4530 Mild persistent asthma, uncomplicated: Secondary | ICD-10-CM

## 2017-12-15 DIAGNOSIS — R0600 Dyspnea, unspecified: Secondary | ICD-10-CM

## 2017-12-15 DIAGNOSIS — R0609 Other forms of dyspnea: Secondary | ICD-10-CM | POA: Diagnosis not present

## 2017-12-15 MED ORDER — BUDESONIDE-FORMOTEROL FUMARATE 80-4.5 MCG/ACT IN AERO: 2.0000 | INHALATION_SPRAY | Freq: Two times a day (BID) | RESPIRATORY_TRACT | 0 refills | Status: DC

## 2017-12-15 MED ORDER — BUDESONIDE-FORMOTEROL FUMARATE 80-4.5 MCG/ACT IN AERO: 2.0000 | INHALATION_SPRAY | Freq: Two times a day (BID) | RESPIRATORY_TRACT | 11 refills | Status: DC
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
## 2017-12-15 NOTE — Patient Instructions (Addendum)
Symbicort 80 Take 2 puffs first thing in am and then another 2 puffs about 12 hours later x 2 weeks, if better stay on it, if no change with symbicort 80 keep it handy if you do start to cough wheeze or short of breath   You will need to be referred to a pain specialist as you didn't respond to topical therapy (Zostrix) and likely to continue until you can find effective therapy for the wound pain = neuralagia which will cause you to have chest wall pain with deep breathing.   There is no direct pulmonary impairment related to the injury on your abdominal wall   Pulmonary follow up is as needed

## 2017-12-18 ENCOUNTER — Encounter: Payer: Self-pay | Admitting: Internal Medicine

## 2017-12-18 DIAGNOSIS — J4530 Mild persistent asthma, uncomplicated: Secondary | ICD-10-CM | POA: Insufficient documentation
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
## 2017-12-18 NOTE — Assessment & Plan Note (Addendum)
PFT's  11/29/2017  FEV1 3.76 (80 % ) ratio 73  p 19 % improvement from saba p nothing prior to study with DLCO  80/77c  % corrects to 90  % for alv volume   - 12/15/2017  After extensive coaching inhaler device  effectiveness =    75%  > try symb 80 2bid   Not clear this is really clinically relevant in absence of variability or noct symptoms but reasonable to try symb 80 bid and see what difference if any it makes in his symptoms and if still concerned re poor ext tol then cpst on symbicort reasonable next step.    Otherwise, symb 80 can be prn based on Based on two studies from NEJM  378; 20 p 1865 (2018) and 380 : p2020-30 (2019) in pts with mild asthma it is reasonable to use low dose symbicort eg 80 2bid "prn" flare in this setting but I emphasized this was only shown with symbicort and takes advantage of the rapid onset of action but is not the same as "rescue therapy" but can be stopped once the acute symptoms have resolved and the need for rescue has been minimized (< 2 x weekly)     > 50 % of summary 25 m visit spent in counseling  See device teaching which extended face to face time for this visit

## 2018-04-11 DIAGNOSIS — Z125 Encounter for screening for malignant neoplasm of prostate: Secondary | ICD-10-CM | POA: Diagnosis not present

## 2018-04-11 DIAGNOSIS — N529 Male erectile dysfunction, unspecified: Secondary | ICD-10-CM | POA: Diagnosis not present

## 2018-04-11 DIAGNOSIS — Z0000 Encounter for general adult medical examination without abnormal findings: Secondary | ICD-10-CM | POA: Diagnosis not present

## 2018-04-19 DIAGNOSIS — M5416 Radiculopathy, lumbar region: Secondary | ICD-10-CM | POA: Diagnosis not present

## 2018-04-26 DIAGNOSIS — Z0000 Encounter for general adult medical examination without abnormal findings: Secondary | ICD-10-CM | POA: Diagnosis not present

## 2018-04-26 DIAGNOSIS — Z13220 Encounter for screening for lipoid disorders: Secondary | ICD-10-CM | POA: Diagnosis not present

## 2018-05-20 ENCOUNTER — Ambulatory Visit: Payer: 59 | Admitting: Podiatry

## 2018-05-20 ENCOUNTER — Encounter: Payer: Self-pay | Admitting: Podiatry

## 2018-05-20 ENCOUNTER — Ambulatory Visit (INDEPENDENT_AMBULATORY_CARE_PROVIDER_SITE_OTHER): Payer: 59

## 2018-05-20 ENCOUNTER — Other Ambulatory Visit: Payer: Self-pay | Admitting: Podiatry

## 2018-05-20 DIAGNOSIS — G5762 Lesion of plantar nerve, left lower limb: Secondary | ICD-10-CM

## 2018-05-20 DIAGNOSIS — M779 Enthesopathy, unspecified: Secondary | ICD-10-CM

## 2018-05-20 MED ORDER — MELOXICAM 15 MG PO TABS: 15.0000 mg | ORAL_TABLET | Freq: Every day | ORAL | 1 refills | Status: AC
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
## 2018-06-19 ENCOUNTER — Encounter: Payer: 59 | Admitting: Podiatry

## 2018-06-30 NOTE — Progress Notes (Signed)
This encounter was created in error - please disregard.

## 2018-07-24 ENCOUNTER — Ambulatory Visit: Payer: 59 | Admitting: Podiatry

## 2018-07-24 ENCOUNTER — Encounter: Payer: Self-pay | Admitting: Podiatry

## 2018-07-24 ENCOUNTER — Telehealth: Payer: Self-pay | Admitting: Podiatry

## 2018-07-24 DIAGNOSIS — G5762 Lesion of plantar nerve, left lower limb: Secondary | ICD-10-CM

## 2018-07-24 MED ORDER — MELOXICAM 15 MG PO TABS: 15.0000 mg | ORAL_TABLET | Freq: Every day | ORAL | 1 refills | Status: DC

## 2018-07-24 MED ORDER — MELOXICAM 15 MG PO TABS: ORAL_TABLET | ORAL | 1 refills | Status: DC

## 2018-07-24 NOTE — Addendum Note (Signed)
Addended by: Harriett Sine D on: 07/24/2018 04:24 PM   Modules accepted: Orders

## 2018-07-24 NOTE — Telephone Encounter (Signed)
Meloxicam sent to pt's [phone].

## 2018-07-24 NOTE — Telephone Encounter (Signed)
William Robbins with [facility] called needing clarification directions on the 15 mg Meloxicam. If you could please have that resent.
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
## 2018-08-01 ENCOUNTER — Telehealth: Payer: Self-pay | Admitting: Podiatry

## 2018-08-01 NOTE — Telephone Encounter (Signed)
Pt left message stating he was told at his appt last week someone was going to call him with benefit coverage for the orthotics.He is wanting to know what his out of pocket cost is going to be.   I verified benefits and they are covered @ 80% after 500.00 deductible and pt has not met any. He asked to cancel his appt with Liliane Channel for the orthotics and is scheduled to follow up with Dr Amalia Hailey on 2.26.2020.

## 2018-08-03 ENCOUNTER — Other Ambulatory Visit: Payer: Self-pay | Admitting: Orthotics

## 2018-08-23 ENCOUNTER — Encounter: Payer: Self-pay | Admitting: Podiatry

## 2018-08-23 ENCOUNTER — Ambulatory Visit: Payer: 59 | Admitting: Podiatry

## 2018-08-23 DIAGNOSIS — G5762 Lesion of plantar nerve, left lower limb: Secondary | ICD-10-CM

## 2018-08-23 DIAGNOSIS — G5782 Other specified mononeuropathies of left lower limb: Secondary | ICD-10-CM
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
## 2018-09-01 DIAGNOSIS — R0683 Snoring: Secondary | ICD-10-CM | POA: Diagnosis not present

## 2018-09-01 DIAGNOSIS — M950 Acquired deformity of nose: Secondary | ICD-10-CM | POA: Diagnosis not present

## 2018-10-02 ENCOUNTER — Other Ambulatory Visit (HOSPITAL_BASED_OUTPATIENT_CLINIC_OR_DEPARTMENT_OTHER): Payer: Self-pay

## 2018-10-02 DIAGNOSIS — R0683 Snoring: Secondary | ICD-10-CM

## 2018-10-02 DIAGNOSIS — G4730 Sleep apnea, unspecified: Secondary | ICD-10-CM

## 2018-10-29 DIAGNOSIS — H5789 Other specified disorders of eye and adnexa: Secondary | ICD-10-CM | POA: Diagnosis not present

## 2018-10-29 DIAGNOSIS — S0502XA Injury of conjunctiva and corneal abrasion without foreign body, left eye, initial encounter: Secondary | ICD-10-CM | POA: Diagnosis not present

## 2018-11-15 ENCOUNTER — Encounter (HOSPITAL_BASED_OUTPATIENT_CLINIC_OR_DEPARTMENT_OTHER): Payer: Worker's Compensation

## 2018-11-24 ENCOUNTER — Other Ambulatory Visit: Payer: Self-pay

## 2018-11-24 ENCOUNTER — Ambulatory Visit (HOSPITAL_BASED_OUTPATIENT_CLINIC_OR_DEPARTMENT_OTHER): Payer: 59 | Attending: Otolaryngology | Admitting: Internal Medicine

## 2018-11-24 DIAGNOSIS — G4733 Obstructive sleep apnea (adult) (pediatric): Secondary | ICD-10-CM | POA: Diagnosis present

## 2018-11-24 DIAGNOSIS — R0683 Snoring: Secondary | ICD-10-CM

## 2018-11-24 DIAGNOSIS — G4730 Sleep apnea, unspecified: Secondary | ICD-10-CM

## 2018-12-02 DIAGNOSIS — R0683 Snoring: Secondary | ICD-10-CM | POA: Diagnosis not present

## 2018-12-02 NOTE — Procedures (Signed)
     Patient Name: William Robbins, William Robbins Date: 11/27/2018 Gender: Male D.O.B: 02/11/1966 Age (years): 53 Referring Provider: Melida Quitter Height (inches): 62 Interpreting Physician: Baird [place] MD, ABSM Weight (lbs): 204 RPSGT: Jacolyn Reedy BMI: 25 MRN: [id number] Neck Size:   CLINICAL INFORMATION Sleep Study Type: HST Indication for sleep study: Snoring, Witnessed Apneas Epworth Sleepiness Score: 6  SLEEP STUDY TECHNIQUE A multi-channel overnight portable sleep study was performed. The channels recorded were: nasal airflow, thoracic respiratory movement, and oxygen saturation with a pulse oximetry. Snoring was also monitored.  MEDICATIONS Patient self administered medications include: none reported.  SLEEP ARCHITECTURE Patient was studied for 411.6 minutes. The sleep efficiency was 100.0 % and the patient was supine for 35.6%. The arousal index was 0.0 per hour.  RESPIRATORY PARAMETERS The overall AHI was 36.7 per hour, with a central apnea index of 0.0 per hour. The oxygen nadir was 87% during sleep.  CARDIAC DATA Mean heart rate during sleep was 63.0 bpm.  IMPRESSIONS - Severe obstructive sleep apnea occurred during this study (AHI = 36.7/h). - No significant central sleep apnea occurred during this study (CAI = 0.0/h). - Mild oxygen desaturation was noted during this study (Min O2 = 87%). - Patient snored.  DIAGNOSIS - Obstructive Sleep Apnea (327.23 [G47.33 ICD-10])  RECOMMENDATIONS - Suggest CPAP titration sleep studey or autopap. Other options would be based on clinical judgment. - Be careful with alcohol, sedatives and other CNS depressants that may worsen sleep apnea and disrupt normal sleep architecture. - Sleep hygiene should be reviewed to assess factors that may improve sleep quality. - Weight management and regular exercise should be initiated or continued.  [Electronically signed] 12/02/2018 11:21 AM  Baird [place] MD, ABSM Diplomate,  American Board of Sleep Medicine   NPI: [id number]                        [place], [place] [facility]  ELECTRONICALLY SIGNED ON:  12/02/2018, 11:18 AM [place] PH: [phone]   FX: [phone] [place]

## 2019-01-13 ENCOUNTER — Encounter: Payer: Self-pay | Admitting: Family Medicine

## 2019-01-13 DIAGNOSIS — G4733 Obstructive sleep apnea (adult) (pediatric): Secondary | ICD-10-CM

## 2019-01-13 HISTORY — DX: Obstructive sleep apnea (adult) (pediatric): G47.33

## 2020-01-22 ENCOUNTER — Ambulatory Visit
Admission: RE | Admit: 2020-01-22 | Discharge: 2020-01-22 | Disposition: A | Discharge status: Home / Self Care | Payer: 59 | Source: Ambulatory Visit | Attending: Emergency Medicine | Admitting: Emergency Medicine

## 2020-01-22 ENCOUNTER — Other Ambulatory Visit: Payer: Self-pay

## 2020-01-22 ENCOUNTER — Ambulatory Visit
Admission: RE | Admit: 2020-01-22 | Discharge: 2020-01-22 | Disposition: A | Discharge status: Home / Self Care | Payer: 59 | Attending: Emergency Medicine | Admitting: Emergency Medicine

## 2020-01-22 VITALS — BP 133/87 | HR 76 | Temp 97.9°F | Resp 14

## 2020-01-22 DIAGNOSIS — R059 Cough, unspecified: Secondary | ICD-10-CM

## 2020-01-22 DIAGNOSIS — R042 Hemoptysis: Secondary | ICD-10-CM | POA: Diagnosis not present

## 2020-01-22 DIAGNOSIS — R05 Cough: Secondary | ICD-10-CM

## 2020-01-22 MED ORDER — BENZONATATE 100 MG PO CAPS: 100.0000 mg | ORAL_CAPSULE | Freq: Three times a day (TID) | ORAL | 0 refills | Status: DC | PRN

## 2020-01-22 NOTE — ED Triage Notes (Signed)
Patient is here with concerns about his cough. Reports day one was just a little cough, but he is now coughing to the point he feels like he cannot catch his breath. Reports that he coughed up blood-tinged mucous. Has taken OTC cough medicine which provided some relief. Patient is fully vaccinated. Agreeable to COVID testing.
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
## 2020-01-22 NOTE — Discharge Instructions (Signed)
Go to [facility] for your chest xray.  I will call you with the results this morning.    Take the [facility] as needed for cough.    Follow up with your primary care provider if your symptoms are not improving.

## 2020-01-23 LAB — NOVEL CORONAVIRUS, NAA: SARS-CoV-2, NAA: NOT DETECTED

## 2020-01-23 LAB — SARS-COV-2, NAA 2 DAY TAT

## 2020-05-13 ENCOUNTER — Ambulatory Visit: Payer: 59 | Admitting: Urology

## 2020-05-28 ENCOUNTER — Encounter: Payer: Self-pay | Admitting: Urology

## 2020-05-28 ENCOUNTER — Ambulatory Visit (INDEPENDENT_AMBULATORY_CARE_PROVIDER_SITE_OTHER): Payer: 59 | Admitting: Urology

## 2020-05-28 ENCOUNTER — Other Ambulatory Visit: Payer: Self-pay

## 2020-05-28 VITALS — BP 145/92 | HR 76 | Ht 75.0 in | Wt 205.0 lb

## 2020-05-28 DIAGNOSIS — N529 Male erectile dysfunction, unspecified: Secondary | ICD-10-CM

## 2020-05-28 MED ORDER — TAMSULOSIN HCL 0.4 MG PO CAPS: 0.4000 mg | ORAL_CAPSULE | Freq: Every day | ORAL | 0 refills | Status: DC
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
## 2020-05-29 LAB — LUTEINIZING HORMONE: LH: 6.2 m[IU]/mL (ref 1.7–8.6)

## 2020-05-29 LAB — TESTOSTERONE: Testosterone: 439 ng/dL (ref 264–916)

## 2020-05-30 LAB — URINALYSIS, COMPLETE
Bilirubin, UA: NEGATIVE
Glucose, UA: NEGATIVE
Ketones, UA: NEGATIVE
Leukocytes,UA: NEGATIVE
Nitrite, UA: NEGATIVE
Protein,UA: NEGATIVE
RBC, UA: NEGATIVE
Specific Gravity, UA: 1.025 (ref 1.005–1.030)
Urobilinogen, Ur: 0.2 mg/dL (ref 0.2–1.0)
pH, UA: 7 (ref 5.0–7.5)

## 2020-05-30 LAB — MICROSCOPIC EXAMINATION
Bacteria, UA: NONE SEEN
Epithelial Cells (non renal): NONE SEEN /hpf (ref 0–10)

## 2020-06-02 ENCOUNTER — Encounter: Payer: Self-pay | Admitting: *Deleted

## 2020-06-25 ENCOUNTER — Other Ambulatory Visit: Payer: Self-pay | Admitting: *Deleted

## 2020-06-25 MED ORDER — TAMSULOSIN HCL 0.4 MG PO CAPS: 0.4000 mg | ORAL_CAPSULE | Freq: Every day | ORAL | 3 refills | Status: DC

## 2020-10-28 ENCOUNTER — Other Ambulatory Visit: Payer: Self-pay | Admitting: Urology

## 2020-12-24 ENCOUNTER — Other Ambulatory Visit: Payer: Self-pay | Admitting: Urology

## 2021-02-07 ENCOUNTER — Other Ambulatory Visit: Payer: Self-pay | Admitting: Urology

## 2021-05-29 ENCOUNTER — Ambulatory Visit (INDEPENDENT_AMBULATORY_CARE_PROVIDER_SITE_OTHER): Payer: 59 | Admitting: Urology

## 2021-05-29 ENCOUNTER — Other Ambulatory Visit: Payer: Self-pay

## 2021-05-29 ENCOUNTER — Encounter: Payer: Self-pay | Admitting: Urology

## 2021-05-29 VITALS — BP 165/97 | HR 83 | Ht 75.0 in | Wt 205.0 lb

## 2021-05-29 DIAGNOSIS — R339 Retention of urine, unspecified: Secondary | ICD-10-CM | POA: Diagnosis not present

## 2021-05-29 DIAGNOSIS — N400 Enlarged prostate without lower urinary tract symptoms: Secondary | ICD-10-CM | POA: Diagnosis not present

## 2021-05-29 DIAGNOSIS — R351 Nocturia: Secondary | ICD-10-CM

## 2021-05-29 DIAGNOSIS — N529 Male erectile dysfunction, unspecified: Secondary | ICD-10-CM | POA: Diagnosis not present

## 2021-05-29 LAB — URINALYSIS, COMPLETE
Bilirubin, UA: NEGATIVE
Glucose, UA: NEGATIVE
Ketones, UA: NEGATIVE
Leukocytes,UA: NEGATIVE
Nitrite, UA: NEGATIVE
Protein,UA: NEGATIVE
Specific Gravity, UA: 1.015 (ref 1.005–1.030)
Urobilinogen, Ur: 1 mg/dL (ref 0.2–1.0)
pH, UA: 6.5 (ref 5.0–7.5)

## 2021-05-29 LAB — MICROSCOPIC EXAMINATION
Bacteria, UA: NONE SEEN
Epithelial Cells (non renal): NONE SEEN /hpf (ref 0–10)

## 2021-05-29 LAB — BLADDER SCAN AMB NON-IMAGING: Scan Result: 124
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
## 2021-05-31 ENCOUNTER — Encounter: Payer: Self-pay | Admitting: Urology

## 2021-06-03 ENCOUNTER — Other Ambulatory Visit: Payer: Self-pay | Admitting: Family Medicine

## 2021-06-04 ENCOUNTER — Other Ambulatory Visit: Payer: Self-pay | Admitting: Family Medicine

## 2021-07-19 ENCOUNTER — Ambulatory Visit: Payer: Self-pay

## 2021-08-12 ENCOUNTER — Other Ambulatory Visit: Payer: Self-pay | Admitting: Urology

## 2021-08-12 ENCOUNTER — Other Ambulatory Visit: Payer: Self-pay

## 2021-08-12 ENCOUNTER — Ambulatory Visit
Admission: RE | Admit: 2021-08-12 | Discharge: 2021-08-12 | Disposition: A | Discharge status: Home / Self Care | Payer: 59 | Source: Ambulatory Visit | Attending: Emergency Medicine | Admitting: Emergency Medicine

## 2021-08-12 VITALS — BP 121/71 | HR 96 | Temp 98.2°F | Resp 18

## 2021-08-12 DIAGNOSIS — B349 Viral infection, unspecified: Secondary | ICD-10-CM | POA: Diagnosis not present

## 2021-08-12 LAB — POCT INFLUENZA A/B
Influenza A, POC: NEGATIVE
Influenza B, POC: NEGATIVE

## 2021-08-12 MED ORDER — BENZONATATE 100 MG PO CAPS: 100.0000 mg | ORAL_CAPSULE | Freq: Three times a day (TID) | ORAL | 0 refills | Status: DC | PRN

## 2021-08-12 NOTE — ED Triage Notes (Signed)
Pt presents with cough, fever and bodyaches x 2 days.
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
## 2021-08-12 NOTE — Discharge Instructions (Addendum)
Your flu test is negative. Your COVID test is pending.  You should self quarantine until the test result is back.    Take the [facility] as needed for cough.  Take Tylenol or ibuprofen as needed for fever or discomfort.  Rest and keep yourself hydrated.    Follow-up with your primary care provider if your symptoms are not improving.

## 2021-08-13 ENCOUNTER — Telehealth: Payer: Self-pay | Admitting: Emergency Medicine

## 2021-08-13 LAB — NOVEL CORONAVIRUS, NAA: SARS-CoV-2, NAA: NOT DETECTED

## 2021-08-13 MED ORDER — PROMETHAZINE-DM 6.25-15 MG/5ML PO SYRP: 5.0000 mL | ORAL_SOLUTION | Freq: Every evening | ORAL | 0 refills | Status: DC | PRN

## 2021-08-13 NOTE — Telephone Encounter (Signed)
Patient called to report he is still coughing and the cough kept him awake last night.  Treating with promethazine-DM cough syrup at bedtime.  Instructed patient to follow up with PCP if symptoms are not improving.  He agrees to plan of care.

## 2021-09-04 ENCOUNTER — Ambulatory Visit (HOSPITAL_BASED_OUTPATIENT_CLINIC_OR_DEPARTMENT_OTHER): Admit: 2021-09-04 | Payer: 59 | Admitting: Orthopedic Surgery

## 2021-09-04 ENCOUNTER — Encounter (HOSPITAL_BASED_OUTPATIENT_CLINIC_OR_DEPARTMENT_OTHER): Payer: Self-pay

## 2021-09-04 ENCOUNTER — Other Ambulatory Visit: Payer: Self-pay | Admitting: Orthopedic Surgery

## 2021-09-04 SURGERY — CARPAL TUNNEL RELEASE
Anesthesia: LOCAL | Laterality: Left

## 2021-09-17 ENCOUNTER — Encounter: Payer: Self-pay | Admitting: Nurse Practitioner

## 2021-09-17 ENCOUNTER — Other Ambulatory Visit: Payer: Self-pay

## 2021-09-17 ENCOUNTER — Ambulatory Visit (INDEPENDENT_AMBULATORY_CARE_PROVIDER_SITE_OTHER): Payer: 59 | Admitting: Nurse Practitioner

## 2021-09-17 VITALS — BP 128/82 | HR 73 | Temp 98.0°F | Resp 10 | Ht 74.75 in | Wt 211.4 lb

## 2021-09-17 DIAGNOSIS — G4733 Obstructive sleep apnea (adult) (pediatric): Secondary | ICD-10-CM | POA: Diagnosis not present

## 2021-09-17 DIAGNOSIS — R21 Rash and other nonspecific skin eruption: Secondary | ICD-10-CM | POA: Diagnosis not present

## 2021-09-17 DIAGNOSIS — Z23 Encounter for immunization: Secondary | ICD-10-CM | POA: Diagnosis not present

## 2021-09-17 DIAGNOSIS — Z7689 Persons encountering health services in other specified circumstances: Secondary | ICD-10-CM

## 2021-09-17 DIAGNOSIS — I10 Essential (primary) hypertension: Secondary | ICD-10-CM | POA: Diagnosis not present
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
## 2021-09-17 NOTE — Assessment & Plan Note (Signed)
Currently maintained on amlodipine 10 mg once daily.  Patient tolerating medication well.  Blood pressure within normal limits in office today.  Patient checks blood pressure approximately 1 time at home, continue doing so ?

## 2021-09-17 NOTE — Assessment & Plan Note (Signed)
Able to review recent labs drawn in 05/17/2021 from [place].  Reviewed this with patient all stable within normal limits mildly elevated cholesterol with LDL in the 134.  We will have patient follow-up in 6 months for physical with me sooner if he needs ?

## 2021-09-17 NOTE — Patient Instructions (Signed)
Nice to see you today ?I will see you in 6 months for your physical, sooner if you need me ?I did place a referral for pulmonology they generally manage the CPAPs/Sleep studies ?

## 2021-09-17 NOTE — Assessment & Plan Note (Signed)
Patient has small healing lesions to bilateral lower anterior legs he had been using triamcinolone cream that will make them remit.  Likely psoriasis according to patient's description and what was left on his leg.  Continue using prescription triamcinolone cream.  Did discuss how often and how long to use and side effects of using topical steroids ?
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
## 2021-11-05 ENCOUNTER — Other Ambulatory Visit: Payer: Self-pay

## 2021-11-05 ENCOUNTER — Encounter (HOSPITAL_BASED_OUTPATIENT_CLINIC_OR_DEPARTMENT_OTHER): Payer: Self-pay | Admitting: Orthopedic Surgery

## 2021-11-06 ENCOUNTER — Encounter: Payer: Self-pay | Admitting: Adult Health

## 2021-11-06 ENCOUNTER — Ambulatory Visit (INDEPENDENT_AMBULATORY_CARE_PROVIDER_SITE_OTHER): Payer: 59 | Admitting: Adult Health

## 2021-11-06 VITALS — BP 122/80 | HR 85 | Temp 97.8°F | Ht 74.75 in | Wt 210.6 lb

## 2021-11-06 DIAGNOSIS — G4733 Obstructive sleep apnea (adult) (pediatric): Secondary | ICD-10-CM | POA: Diagnosis not present

## 2021-11-06 DIAGNOSIS — R0683 Snoring: Secondary | ICD-10-CM

## 2021-11-06 NOTE — Assessment & Plan Note (Signed)
Severe obstructive sleep apnea history.  Previous sleep study in 2020 showed severe sleep apnea with AHI 36.7.  Patient had tried CPAP briefly but was intolerant.  We discussed underlying sleep apnea patient education and treatment options.  Patient is in agreement to repeat his sleep study to verify that he continues to have ongoing sleep apnea.  He has had no significant weight changes.  BMI is at 26.  Patient has significant symptoms with snoring witnessed apneic events and restless sleep.  Suspect he continues to have ongoing sleep apnea.  We will repeat a home sleep study.  Pending those results we will decide on treatment options. ? ?- discussed how weight can impact sleep and risk for sleep disordered breathing ?- discussed options to assist with weight loss: combination of diet modification, cardiovascular and strength training exercises ?  ?- had an extensive discussion regarding the adverse health consequences related to untreated sleep disordered breathing ?- specifically discussed the risks for hypertension, coronary artery disease, cardiac dysrhythmias, cerebrovascular disease, and diabetes ?- lifestyle modification discussed ?  ?- discussed how sleep disruption can increase risk of accidents, particularly when driving ?- safe driving practices were discussed ?  ?Plan  ?Patient Instructions  ?Set up for home sleep study  ?Work on healthy weight  ?Do not drive if sleepy  ?Use caution with sedating medications  ?Follow up in 6 weeks to discuss study results and treatment plan .  ? ?  ? ?

## 2021-11-06 NOTE — Patient Instructions (Signed)
Set up for home sleep study  ?Work on healthy weight  ?Do not drive if sleepy  ?Use caution with sedating medications  ?Follow up in 6 weeks to discuss study results and treatment plan .  ? ?

## 2021-11-06 NOTE — Progress Notes (Signed)
Reviewed and agree with assessment/plan. ? ? ?Chesley Mires, MD ?[facility] ?11/06/2021, 5:28 PM ?Pager:  [phone] ? ?
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
## 2021-11-13 ENCOUNTER — Encounter (HOSPITAL_BASED_OUTPATIENT_CLINIC_OR_DEPARTMENT_OTHER)
Admission: RE | Admit: 2021-11-13 | Discharge: 2021-11-13 | Disposition: A | Discharge status: Home / Self Care | Payer: No Typology Code available for payment source | Source: Ambulatory Visit | Attending: Orthopedic Surgery | Admitting: Orthopedic Surgery

## 2021-11-13 DIAGNOSIS — Z01810 Encounter for preprocedural cardiovascular examination: Secondary | ICD-10-CM | POA: Insufficient documentation

## 2021-11-13 NOTE — Progress Notes (Signed)

## 2021-11-16 ENCOUNTER — Other Ambulatory Visit: Payer: Self-pay

## 2021-11-16 ENCOUNTER — Encounter (HOSPITAL_BASED_OUTPATIENT_CLINIC_OR_DEPARTMENT_OTHER): Payer: Self-pay | Admitting: Orthopedic Surgery

## 2021-11-16 ENCOUNTER — Ambulatory Visit (HOSPITAL_BASED_OUTPATIENT_CLINIC_OR_DEPARTMENT_OTHER): Payer: No Typology Code available for payment source | Admitting: Anesthesiology

## 2021-11-16 ENCOUNTER — Ambulatory Visit (HOSPITAL_BASED_OUTPATIENT_CLINIC_OR_DEPARTMENT_OTHER): Payer: No Typology Code available for payment source

## 2021-11-16 ENCOUNTER — Encounter (HOSPITAL_BASED_OUTPATIENT_CLINIC_OR_DEPARTMENT_OTHER)
Admission: RE | Disposition: A | Discharge status: Home / Self Care | Payer: Self-pay | Source: Home / Self Care | Attending: Orthopedic Surgery

## 2021-11-16 ENCOUNTER — Ambulatory Visit (HOSPITAL_BASED_OUTPATIENT_CLINIC_OR_DEPARTMENT_OTHER)
Admission: RE | Admit: 2021-11-16 | Discharge: 2021-11-16 | Disposition: A | Discharge status: Home / Self Care | Payer: No Typology Code available for payment source | Attending: Orthopedic Surgery | Admitting: Orthopedic Surgery

## 2021-11-16 DIAGNOSIS — M19032 Primary osteoarthritis, left wrist: Secondary | ICD-10-CM

## 2021-11-16 DIAGNOSIS — I10 Essential (primary) hypertension: Secondary | ICD-10-CM | POA: Insufficient documentation

## 2021-11-16 DIAGNOSIS — G709 Myoneural disorder, unspecified: Secondary | ICD-10-CM | POA: Diagnosis not present

## 2021-11-16 DIAGNOSIS — G4733 Obstructive sleep apnea (adult) (pediatric): Secondary | ICD-10-CM | POA: Diagnosis not present

## 2021-11-16 DIAGNOSIS — Z01818 Encounter for other preprocedural examination: Secondary | ICD-10-CM

## 2021-11-16 HISTORY — PX: CARPECTOMY: SHX5004

## 2021-11-16 HISTORY — DX: Post-traumatic stress disorder, unspecified: F43.10

## 2021-11-16 HISTORY — PX: JOINT RESURFACING OF THE WRIST: SHX6756

## 2021-11-16 SURGERY — CARPECTOMY
Anesthesia: Monitor Anesthesia Care | Site: Wrist | Laterality: Left

## 2021-11-16 MED ORDER — PROPOFOL 10 MG/ML IV BOLUS
INTRAVENOUS | Status: AC
Start: 2021-11-16 — End: ?
  Filled 2021-11-16: qty 20

## 2021-11-16 MED ORDER — FENTANYL CITRATE (PF) 100 MCG/2ML IJ SOLN
100.0000 ug | Freq: Once | INTRAMUSCULAR | Status: AC
  Administered 2021-11-16: 100 ug via INTRAVENOUS

## 2021-11-16 MED ORDER — CLONIDINE HCL (ANALGESIA) 100 MCG/ML EP SOLN
EPIDURAL | Status: DC | PRN
  Administered 2021-11-16: 50 ug

## 2021-11-16 MED ORDER — PROPOFOL 500 MG/50ML IV EMUL
INTRAVENOUS | Status: DC | PRN
  Administered 2021-11-16: 150 ug/kg/min via INTRAVENOUS

## 2021-11-16 MED ORDER — FENTANYL CITRATE (PF) 100 MCG/2ML IJ SOLN
INTRAMUSCULAR | Status: AC
  Filled 2021-11-16: qty 2

## 2021-11-16 MED ORDER — CEFAZOLIN SODIUM-DEXTROSE 2-4 GM/100ML-% IV SOLN
2.0000 g | INTRAVENOUS | Status: AC
  Administered 2021-11-16: 2 g via INTRAVENOUS

## 2021-11-16 MED ORDER — DEXAMETHASONE SODIUM PHOSPHATE 4 MG/ML IJ SOLN
INTRAMUSCULAR | Status: DC | PRN
  Administered 2021-11-16: 10 mg via INTRAVENOUS

## 2021-11-16 MED ORDER — BUPIVACAINE-EPINEPHRINE (PF) 0.5% -1:200000 IJ SOLN
INTRAMUSCULAR | Status: DC | PRN
  Administered 2021-11-16: 30 mL via PERINEURAL

## 2021-11-16 MED ORDER — OXYCODONE-ACETAMINOPHEN 5-325 MG PO TABS: 1.0000 | ORAL_TABLET | ORAL | 0 refills | Status: DC | PRN

## 2021-11-16 MED ORDER — CELECOXIB 200 MG PO CAPS
ORAL_CAPSULE | ORAL | Status: AC
  Filled 2021-11-16: qty 1

## 2021-11-16 MED ORDER — PROPOFOL 10 MG/ML IV BOLUS
INTRAVENOUS | Status: DC | PRN
  Administered 2021-11-16: 180 mg via INTRAVENOUS

## 2021-11-16 MED ORDER — DEXMEDETOMIDINE (PRECEDEX) IN NS 20 MCG/5ML (4 MCG/ML) IV SYRINGE
PREFILLED_SYRINGE | INTRAVENOUS | Status: DC | PRN
  Administered 2021-11-16 (×2): 8 ug via INTRAVENOUS

## 2021-11-16 MED ORDER — ONDANSETRON HCL 4 MG/2ML IJ SOLN
INTRAMUSCULAR | Status: AC
Start: 2021-11-16 — End: ?
  Filled 2021-11-16: qty 2

## 2021-11-16 MED ORDER — FENTANYL CITRATE (PF) 100 MCG/2ML IJ SOLN: 25.0000 ug | INTRAMUSCULAR | Status: DC | PRN

## 2021-11-16 MED ORDER — MIDAZOLAM HCL 2 MG/2ML IJ SOLN
2.0000 mg | Freq: Once | INTRAMUSCULAR | Status: AC
  Administered 2021-11-16: 2 mg via INTRAVENOUS

## 2021-11-16 MED ORDER — ACETAMINOPHEN 500 MG PO TABS
1000.0000 mg | ORAL_TABLET | Freq: Once | ORAL | Status: AC
  Administered 2021-11-16: 1000 mg via ORAL

## 2021-11-16 MED ORDER — FENTANYL CITRATE (PF) 100 MCG/2ML IJ SOLN
INTRAMUSCULAR | Status: DC | PRN
  Administered 2021-11-16 (×2): 50 ug via INTRAVENOUS

## 2021-11-16 MED ORDER — DEXAMETHASONE SODIUM PHOSPHATE 10 MG/ML IJ SOLN
INTRAMUSCULAR | Status: AC
  Filled 2021-11-16: qty 1

## 2021-11-16 MED ORDER — MIDAZOLAM HCL 2 MG/2ML IJ SOLN
INTRAMUSCULAR | Status: AC
Start: 2021-11-16 — End: ?
  Filled 2021-11-16: qty 2

## 2021-11-16 MED ORDER — ACETAMINOPHEN 500 MG PO TABS
ORAL_TABLET | ORAL | Status: AC
  Filled 2021-11-16: qty 2

## 2021-11-16 MED ORDER — 0.9 % SODIUM CHLORIDE (POUR BTL) OPTIME
TOPICAL | Status: DC | PRN
  Administered 2021-11-16: 300 mL

## 2021-11-16 MED ORDER — ONDANSETRON HCL 4 MG/2ML IJ SOLN
INTRAMUSCULAR | Status: DC | PRN
  Administered 2021-11-16: 4 mg via INTRAVENOUS

## 2021-11-16 MED ORDER — CELECOXIB 200 MG PO CAPS
200.0000 mg | ORAL_CAPSULE | Freq: Once | ORAL | Status: AC
  Administered 2021-11-16: 200 mg via ORAL

## 2021-11-16 MED ORDER — LACTATED RINGERS IV SOLN: INTRAVENOUS | Status: DC

## 2021-11-16 MED ORDER — CEFAZOLIN SODIUM-DEXTROSE 2-4 GM/100ML-% IV SOLN
INTRAVENOUS | Status: AC
  Filled 2021-11-16: qty 100

## 2021-11-16 SURGICAL SUPPLY — 78 items
APL SKNCLS STERI-STRIP NONHPOA (GAUZE/BANDAGES/DRESSINGS) ×1
BAG DECANTER FOR FLEXI CONT (MISCELLANEOUS) IMPLANT
BENZOIN TINCTURE PRP APPL 2/3 (GAUZE/BANDAGES/DRESSINGS) ×1 IMPLANT
BLADE MINI RND TIP GREEN BEAV (BLADE) IMPLANT
BLADE OSCIL/SAGITTAL W/10 ST (BLADE) IMPLANT
BLADE SURG 15 STRL LF DISP TIS (BLADE) ×1 IMPLANT
BLADE SURG 15 STRL SS (BLADE) ×2
BNDG CMPR 9X4 STRL LF SNTH (GAUZE/BANDAGES/DRESSINGS) ×1
BNDG ELASTIC 2X5.8 VLCR STR LF (GAUZE/BANDAGES/DRESSINGS) IMPLANT
BNDG ELASTIC 4X5.8 VLCR STR LF (GAUZE/BANDAGES/DRESSINGS) ×2 IMPLANT
BNDG ESMARK 4X9 LF (GAUZE/BANDAGES/DRESSINGS) ×1 IMPLANT
BNDG GAUZE ELAST 4 BULKY (GAUZE/BANDAGES/DRESSINGS) ×2 IMPLANT
CANISTER SUCT 1200ML W/VALVE (MISCELLANEOUS) IMPLANT
CMPNT CRPL 35X2315STRL (Orthopedic Implant) ×1 IMPLANT
COMPONENT CRPL 35X2315STRL (Orthopedic Implant) IMPLANT
CORD BIPOLAR FORCEPS 12FT (ELECTRODE) ×2 IMPLANT
COVER BACK TABLE 60X90IN (DRAPES) ×2 IMPLANT
CUFF TOURN SGL QUICK 18X4 (TOURNIQUET CUFF) ×1 IMPLANT
DRAPE EXTREMITY T 121X128X90 (DISPOSABLE) ×2 IMPLANT
DRAPE OEC MINIVIEW 54X84 (DRAPES) ×2 IMPLANT
DRAPE SURG 17X23 STRL (DRAPES) ×2 IMPLANT
DURAPREP 26ML APPLICATOR (WOUND CARE) ×3 IMPLANT
ELECT REM PT RETURN 9FT ADLT (ELECTROSURGICAL)
ELECTRODE REM PT RTRN 9FT ADLT (ELECTROSURGICAL) IMPLANT
GAUZE 4X4 16PLY ~~LOC~~+RFID DBL (SPONGE) IMPLANT
GAUZE SPONGE 4X4 12PLY STRL (GAUZE/BANDAGES/DRESSINGS) ×2 IMPLANT
GAUZE XEROFORM 1X8 LF (GAUZE/BANDAGES/DRESSINGS) IMPLANT
GLOVE SURG SYN 8.0 (GLOVE) ×4 IMPLANT
GLOVE SURG SYN 8.0 PF PI (GLOVE) ×2 IMPLANT
GOWN STRL REIN XL XLG (GOWN DISPOSABLE) ×4 IMPLANT
GOWN STRL REUS W/ TWL LRG LVL3 (GOWN DISPOSABLE) ×1 IMPLANT
GOWN STRL REUS W/TWL LRG LVL3 (GOWN DISPOSABLE) ×2
IMPL CAPITATE TAPR POST 7.5 (Orthopedic Implant) IMPLANT
IMPLANT CAPITATE COMP 35X23 (Orthopedic Implant) ×2 IMPLANT
IMPLANT CAPITATE TAPR POST 7.5 (Orthopedic Implant) ×2 IMPLANT
NDL HYPO 25X1 1.5 SAFETY (NEEDLE) ×1 IMPLANT
NEEDLE HYPO 25X1 1.5 SAFETY (NEEDLE) IMPLANT
NS IRRIG 1000ML POUR BTL (IV SOLUTION) ×2 IMPLANT
PACK BASIN DAY SURGERY FS (CUSTOM PROCEDURE TRAY) ×2 IMPLANT
PAD CAST 3X4 CTTN HI CHSV (CAST SUPPLIES) ×1 IMPLANT
PAD CAST 4YDX4 CTTN HI CHSV (CAST SUPPLIES) IMPLANT
PADDING CAST ABS 4INX4YD NS (CAST SUPPLIES) ×1
PADDING CAST ABS COTTON 4X4 ST (CAST SUPPLIES) ×1 IMPLANT
PADDING CAST COTTON 3X4 STRL (CAST SUPPLIES) ×2
PADDING CAST COTTON 4X4 STRL (CAST SUPPLIES) ×2
PADDING UNDERCAST 2 STRL (CAST SUPPLIES)
PADDING UNDERCAST 2X4 STRL (CAST SUPPLIES) IMPLANT
PENCIL SMOKE EVACUATOR (MISCELLANEOUS) IMPLANT
SHEET MEDIUM DRAPE 40X70 STRL (DRAPES) ×2 IMPLANT
SLING ARM FOAM STRAP LRG (SOFTGOODS) ×1 IMPLANT
SPIKE FLUID TRANSFER (MISCELLANEOUS) IMPLANT
SPLINT PLASTER CAST XFAST 3X15 (CAST SUPPLIES) IMPLANT
SPLINT PLASTER CAST XFAST 4X15 (CAST SUPPLIES) ×5 IMPLANT
SPLINT PLASTER XTRA FAST SET 4 (CAST SUPPLIES)
SPLINT PLASTER XTRA FASTSET 3X (CAST SUPPLIES) ×20
STOCKINETTE 4X48 STRL (DRAPES) ×2 IMPLANT
STRIP CLOSURE SKIN 1/2X4 (GAUZE/BANDAGES/DRESSINGS) ×1 IMPLANT
SUCTION FRAZIER HANDLE 10FR (MISCELLANEOUS) ×2
SUCTION TUBE FRAZIER 10FR DISP (MISCELLANEOUS) IMPLANT
SUT ETHIBOND 3-0 V-5 (SUTURE) IMPLANT
SUT ETHILON 4 0 PS 2 18 (SUTURE) IMPLANT
SUT FIBERWIRE 2-0 18 17.9 3/8 (SUTURE) ×4
SUT MERSILENE 4 0 P 3 (SUTURE) IMPLANT
SUT PROLENE 3 0 PS 2 (SUTURE) ×1 IMPLANT
SUT SILK 2 0 PERMA HAND 18 BK (SUTURE) IMPLANT
SUT SILK 4 0 PS 2 (SUTURE) IMPLANT
SUT VIC AB 3-0 FS2 27 (SUTURE) IMPLANT
SUT VIC AB 4-0 P-3 18XBRD (SUTURE) IMPLANT
SUT VIC AB 4-0 P3 18 (SUTURE)
SUT VICRYL 4-0 PS2 18IN ABS (SUTURE) ×1 IMPLANT
SUT VICRYL RAPIDE 4-0 (SUTURE) IMPLANT
SUT VICRYL RAPIDE 4/0 PS 2 (SUTURE) IMPLANT
SUTURE FIBERWR 2-0 18 17.9 3/8 (SUTURE) IMPLANT
SYR 10ML LL (SYRINGE) ×1 IMPLANT
SYR BULB EAR ULCER 3OZ GRN STR (SYRINGE) ×2 IMPLANT
TOWEL GREEN STERILE FF (TOWEL DISPOSABLE) ×2 IMPLANT
TUBE CONNECTING 20X1/4 (TUBING) ×1 IMPLANT
UNDERPAD 30X36 HEAVY ABSORB (UNDERPADS AND DIAPERS) ×2 IMPLANT
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
## 2021-11-16 NOTE — Transfer of Care (Signed)
Immediate Anesthesia Transfer of Care Note  Patient: William Robbins  Procedure(s) Performed: LEFT WRIST PROXIMAL ROW CARPECTOMY WITH  CAPITATE RESURFACING ARTHROPLASTY WITH POSTERIOR INTEROSSEOUS NERVE INCESION (Left: Wrist) JOINT RESURFACING OF LEFT WRIST (Left: Wrist)  Patient Location: PACU  Anesthesia Type:General and Regional  Level of Consciousness: drowsy  Airway & Oxygen Therapy: Patient Spontanous Breathing and Patient connected to face mask oxygen  Post-op Assessment: Report given to RN and Post -op Vital signs reviewed and stable  Post vital signs: Reviewed and stable  Last Vitals:  Vitals Value Taken Time  BP 108/66 11/16/21 1021  Temp 36.7 C 11/16/21 1021  Pulse 63 11/16/21 1022  Resp 9 11/16/21 1022  SpO2 96 % 11/16/21 1022  Vitals shown include unvalidated device data.  Last Pain:  Vitals:   11/16/21 0718  TempSrc: Oral  PainSc: 0-No pain      Patients Stated Pain Goal: 3 (83/81/84 0375)  Complications: No notable events documented.

## 2021-11-16 NOTE — Anesthesia Postprocedure Evaluation (Signed)
Anesthesia Post Note  Patient: William Robbins  Procedure(s) Performed: LEFT WRIST PROXIMAL ROW CARPECTOMY WITH  CAPITATE RESURFACING ARTHROPLASTY WITH POSTERIOR INTEROSSEOUS NERVE INCESION (Left: Wrist) JOINT RESURFACING OF LEFT WRIST (Left: Wrist)     Patient location during evaluation: PACU Anesthesia Type: Regional and General Level of consciousness: awake and alert Pain management: pain level controlled Vital Signs Assessment: post-procedure vital signs reviewed and stable Respiratory status: spontaneous breathing, nonlabored ventilation, respiratory function stable and patient connected to nasal cannula oxygen Cardiovascular status: blood pressure returned to baseline and stable Postop Assessment: no apparent nausea or vomiting Anesthetic complications: no   No notable events documented.  Last Vitals:  Vitals:   11/16/21 1100 11/16/21 1125  BP: 125/87 123/81  Pulse: 63 66  Resp: 17 18  Temp:  36.6 C  SpO2: 94% 94%    Last Pain:  Vitals:   11/16/21 1125  TempSrc:   PainSc: 2                  Tiajuana Amass

## 2021-11-16 NOTE — Brief Op Note (Signed)
11/16/2021  9:58 AM  PATIENT:  William Robbins  56 y.o. male  PRE-OPERATIVE DIAGNOSIS:  LEFT WRIST ARTHRITIS  POST-OPERATIVE DIAGNOSIS:  LEFT WRIST ARTHRITIS  PROCEDURE:  Procedure(s) with comments: LEFT WRIST PROXIMAL ROW CARPECTOMY WITH  CAPITATE RESURFACING ARTHROPLASTY WITH POSTERIOR INTEROSSEOUS NERVE INCESION (Left) - MAC WITH AXILLARY BLOCK JOINT RESURFACING OF LEFT WRIST (Left)  SURGEON:  Surgeon(s) and Role:    Charlotte Crumb, MD - Primary  PHYSICIAN ASSISTANT:   ASSISTANTS: Leverne Humbles PA-C  ANESTHESIA:   regional  EBL: Minimal  BLOOD ADMINISTERED:none  DRAINS: none   LOCAL MEDICATIONS USED:  NONE  SPECIMEN:  No Specimen  DISPOSITION OF SPECIMEN:  N/A  COUNTS:  YES  TOURNIQUET:  * Missing tourniquet times found for documented tourniquets in log: [id number] *  DICTATION: .Dragon Dictation  PLAN OF CARE: Discharge to home after PACU  PATIENT DISPOSITION:  PACU - hemodynamically stable.   Delay start of Pharmacological VTE agent (>24hrs) due to surgical blood loss or risk of bleeding: not applicable

## 2021-11-16 NOTE — Progress Notes (Signed)
Assisted Dr. Rodman Comp with left, axillary, ultrasound guided block. Side rails up, monitors on throughout procedure. See vital signs in flow sheet. Tolerated Procedure well.

## 2021-11-16 NOTE — Discharge Instructions (Addendum)
  Post Anesthesia Home Care Instructions  Activity: Get plenty of rest for the remainder of the day. A responsible individual must stay with you for 24 hours following the procedure.  For the next 24 hours, DO NOT: -Drive a car -Paediatric nurse -Drink alcoholic beverages -Take any medication unless instructed by your physician -Make any legal decisions or sign important papers.  Meals: Start with liquid foods such as gelatin or soup. Progress to regular foods as tolerated. Avoid greasy, spicy, heavy foods. If nausea and/or vomiting occur, drink only clear liquids until the nausea and/or vomiting subsides. Call your physician if vomiting continues.  Special Instructions/Symptoms: Your throat may feel dry or sore from the anesthesia or the breathing tube placed in your throat during surgery. If this causes discomfort, gargle with warm salt water. The discomfort should disappear within 24 hours.  If you had a scopolamine patch placed behind your ear for the management of post- operative nausea and/or vomiting:  1. The medication in the patch is effective for 72 hours, after which it should be removed.  Wrap patch in a tissue and discard in the trash. Wash hands thoroughly with soap and water. 2. You may remove the patch earlier than 72 hours if you experience unpleasant side effects which may include dry mouth, dizziness or visual disturbances. 3. Avoid touching the patch. Wash your hands with soap and water after contact with the patch   Regional Anesthesia Blocks  1. Numbness or the inability to move the "blocked" extremity may last from 3-48 hours after placement. The length of time depends on the medication injected and your individual response to the medication. If the numbness is not going away after 48 hours, call your surgeon.  2. The extremity that is blocked will need to be protected until the numbness is gone and the  Strength has returned. Because you cannot feel it, you will  need to take extra care to avoid injury. Because it may be weak, you may have difficulty moving it or using it. You may not know what position it is in without looking at it while the block is in effect.  3. For blocks in the legs and feet, returning to weight bearing and walking needs to be done carefully. You will need to wait until the numbness is entirely gone and the strength has returned. You should be able to move your leg and foot normally before you try and bear weight or walk. You will need someone to be with you when you first try to ensure you do not fall and possibly risk injury.  4. Bruising and tenderness at the needle site are common side effects and will resolve in a few days.  5. Persistent numbness or new problems with movement should be communicated to the surgeon or the [place] [phone] [place] [phone]).    No tylenol or ibuprofen until after 2:15 today.

## 2021-11-16 NOTE — Anesthesia Procedure Notes (Signed)
Procedure Name: LMA Insertion Date/Time: 11/16/2021 8:52 AM Performed by: Lieutenant Diego, CRNA Pre-anesthesia Checklist: Patient identified, Emergency Drugs available, Suction available and Patient being monitored Patient Re-evaluated:Patient Re-evaluated prior to induction Oxygen Delivery Method: Circle system utilized Preoxygenation: Pre-oxygenation with 100% oxygen Induction Type: IV induction Ventilation: Mask ventilation without difficulty LMA: LMA inserted LMA Size: 5.0 Number of attempts: 1 Placement Confirmation: positive ETCO2 and breath sounds checked- equal and bilateral Tube secured with: Tape Dental Injury: Teeth and Oropharynx as per pre-operative assessment

## 2021-11-16 NOTE — Op Note (Signed)
Patient was taken to the operating suite and after induction of adequate regional anesthetic and then general laryngeal mask anesthetic the left upper extremity was prepped and draped in the usual sterile fashion.  An Esmarch was used to exsanguinate the limb and the tourniquet was inflated to 250 mmHg.  This point time an incision made dorsally over the wrist in the interval between the second third and fourth dorsal compartments.  Dissection was carried down to the area of Lister's tubercle and the EPL tendon was identified and released from its sheath and retracted radially we then retracted the second dorsal compartment radially and the fourth dorsal compartment ulnarly.  We then raised a proximally based capsular flap and identified the posterior osseous nerve at the base of the fourth dorsal compartment.  This was carefully excised.  We then dissected out the proximal and distal carpal rows.  There was significant diastases between the scaphoid and lunate and significant radial scaphoid arthritis.  The scaphoid was removed in its entirety using combination of curettes, osteotomes, and a rondure were.  Once this was done we were able to inspect the head of the capitate that showed significant degenerative changes.  We then decided to proceed with the continuation of the proximal row carpectomy and a capitate resurfacing arthroplasty.  The lunate, and triquetrum were removed in their entirety and fluoroscopic imaging was used to establish adequate resection of the proximal carpal row.  We took care to preserve the volar wrist ligaments.  The wound was irrigated of all osseous debris.  We then using the capitate resurfacing arthroplasty wrist motion set used a guidepin to establish the middle of the capitate on multiple views.  We then used the appropriate reamers to ream and placed a post.  We measured a 15 mm capitate head size and also measured the lunate facet on the distal radius that measured 35 x 23.  We  performed the appropriate reaming to allow placement of a trial.  Fluoroscopic imaging revealed good placement of the trial in all 3 views.  This was removed and some final reaming was undertaken to assure proper seating of the implant.  We then thoroughly irrigated and then placed a 15 mm x 35 x 23 Arthrosurface wrist motion capitate resurfacing arthroplasty component on the head of the capitate this was secured using the [facility] taper.  Fluoroscopic imaging and manual examination of the wrist revealed good motion and stability.  We then irrigated loosely closed the capsule with 2-0 FiberWire followed by the retinaculum using 4-0 Vicryl, 4-0 Vicryl subcutaneously, and a 3-0 Prolene subcuticular stitch on the skin.  Steri-Strips, 4 x 4's, and a dorsal and volar splint were applied.  The patient tolerated these procedures well and went to recovery room in stable fashion.

## 2021-11-16 NOTE — Anesthesia Preprocedure Evaluation (Addendum)
Anesthesia Evaluation  Patient identified by MRN, date of birth, ID band Patient awake    Reviewed: Allergy & Precautions, NPO status , Patient's Chart, lab work & pertinent test results  Airway Mallampati: II  TM Distance: >3 FB Neck ROM: Full    Dental   Pulmonary asthma , sleep apnea ,    breath sounds clear to auscultation       Cardiovascular hypertension, Pt. on medications  Rhythm:Regular Rate:Normal     Neuro/Psych  Neuromuscular disease    GI/Hepatic negative GI ROS, Neg liver ROS,   Endo/Other  negative endocrine ROS  Renal/GU negative Renal ROS     Musculoskeletal   Abdominal   Peds  Hematology negative hematology ROS (+)   Anesthesia Other Findings   Reproductive/Obstetrics                             Anesthesia Physical Anesthesia Plan  ASA: 2  Anesthesia Plan: MAC and Regional   Post-op Pain Management: Regional block*, Tylenol PO (pre-op)* and Celebrex PO (pre-op)*   Induction:   PONV Risk Score and Plan: 1 and Propofol infusion, Ondansetron and Treatment may vary due to age or medical condition  Airway Management Planned: Natural Airway and Simple Face Mask  Additional Equipment:   Intra-op Plan:   Post-operative Plan:   Informed Consent: I have reviewed the patients History and Physical, chart, labs and discussed the procedure including the risks, benefits and alternatives for the proposed anesthesia with the patient or authorized representative who has indicated his/her understanding and acceptance.     Dental advisory given  Plan Discussed with:   Anesthesia Plan Comments:         Anesthesia Quick Evaluation

## 2021-11-16 NOTE — Anesthesia Procedure Notes (Signed)
Anesthesia Regional Block: Axillary brachial plexus block   Pre-Anesthetic Checklist: , timeout performed,  Correct Patient, Correct Site, Correct Laterality,  Correct Procedure, Correct Position, site marked,  Risks and benefits discussed,  Surgical consent,  Pre-op evaluation,  At surgeon's request and post-op pain management  Laterality: Left  Prep: chloraprep       Needles:  Injection technique: Single-shot  Needle Type: Echogenic Stimulator Needle     Needle Length: 9cm  Needle Gauge: 21     Additional Needles:   Procedures:,,,, ultrasound used (permanent image in chart),,     Nerve Stimulator or Paresthesia:  Response: radial, median and ulnar responses elicited, 0.5 mA  Additional Responses:   Narrative:  Start time: 11/16/2021 8:00 AM End time: 11/16/2021 8:09 AM Injection made incrementally with aspirations every 5 mL.  Performed by: Personally  Anesthesiologist: Suzette Battiest, MD

## 2021-11-17 ENCOUNTER — Encounter (HOSPITAL_BASED_OUTPATIENT_CLINIC_OR_DEPARTMENT_OTHER): Payer: Self-pay | Admitting: Orthopedic Surgery

## 2021-11-26 HISTORY — PX: WRIST SURGERY: SHX841

## 2021-12-14 ENCOUNTER — Ambulatory Visit: Payer: 59

## 2021-12-14 DIAGNOSIS — G4733 Obstructive sleep apnea (adult) (pediatric): Secondary | ICD-10-CM

## 2021-12-14 DIAGNOSIS — R0683 Snoring: Secondary | ICD-10-CM

## 2021-12-18 DIAGNOSIS — G4733 Obstructive sleep apnea (adult) (pediatric): Secondary | ICD-10-CM | POA: Diagnosis not present

## 2022-01-12 ENCOUNTER — Encounter: Payer: Self-pay | Admitting: Adult Health

## 2022-01-12 ENCOUNTER — Ambulatory Visit (INDEPENDENT_AMBULATORY_CARE_PROVIDER_SITE_OTHER): Payer: 59 | Admitting: Adult Health

## 2022-01-12 VITALS — BP 126/74 | HR 72 | Temp 98.0°F | Ht 75.0 in | Wt 216.6 lb

## 2022-01-12 DIAGNOSIS — G4733 Obstructive sleep apnea (adult) (pediatric): Secondary | ICD-10-CM
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
## 2022-01-12 NOTE — Patient Instructions (Signed)
Begin CPAP At bedtime  , wear all night long , for at least 6hr or more  Work on healthy weight  Do not drive if sleepy  Healthy sleep regimen  Follow up in 3 months and As needed

## 2022-01-12 NOTE — Progress Notes (Signed)
Reviewed and agree with assessment/plan.   Chesley Mires, MD [facility] Pulmonary/Critical Care 01/12/2022, 3:24 PM Pager:  [phone]

## 2022-01-12 NOTE — Assessment & Plan Note (Signed)
Repeat sleep study shows moderate sleep apnea.  Patient has significant symptom burden with snoring and restless sleep and daytime sleepiness. Patient education was given on sleep apnea.  Patient to begin on CPAP.  AutoSet 5 to 15 cm H2O.  Tried DreamWear nasal mask.  - discussed how weight can impact sleep and risk for sleep disordered breathing - discussed options to assist with weight loss: combination of diet modification, cardiovascular and strength training exercises   - had an extensive discussion regarding the adverse health consequences related to untreated sleep disordered breathing - specifically discussed the risks for hypertension, coronary artery disease, cardiac dysrhythmias, cerebrovascular disease, and diabetes - lifestyle modification discussed   - discussed how sleep disruption can increase risk of accidents, particularly when driving - safe driving practices were discussed   Plan  Patient Instructions  Begin CPAP At bedtime  , wear all night long , for at least 6hr or more  Work on healthy weight  Do not drive if sleepy  Healthy sleep regimen  Follow up in 3 months and As needed

## 2022-02-03 ENCOUNTER — Other Ambulatory Visit: Payer: Self-pay | Admitting: Urology

## 2022-03-23 ENCOUNTER — Ambulatory Visit (INDEPENDENT_AMBULATORY_CARE_PROVIDER_SITE_OTHER): Payer: 59 | Admitting: Family Medicine

## 2022-03-23 ENCOUNTER — Encounter: Payer: Self-pay | Admitting: Family Medicine

## 2022-03-23 VITALS — BP 136/80 | HR 74 | Temp 97.7°F | Ht 74.25 in | Wt 207.2 lb

## 2022-03-23 DIAGNOSIS — Z23 Encounter for immunization: Secondary | ICD-10-CM

## 2022-03-23 DIAGNOSIS — R351 Nocturia: Secondary | ICD-10-CM

## 2022-03-23 DIAGNOSIS — N529 Male erectile dysfunction, unspecified: Secondary | ICD-10-CM

## 2022-03-23 DIAGNOSIS — G4733 Obstructive sleep apnea (adult) (pediatric): Secondary | ICD-10-CM

## 2022-03-23 DIAGNOSIS — N401 Enlarged prostate with lower urinary tract symptoms: Secondary | ICD-10-CM

## 2022-03-23 DIAGNOSIS — Z0000 Encounter for general adult medical examination without abnormal findings: Secondary | ICD-10-CM | POA: Diagnosis not present

## 2022-03-23 DIAGNOSIS — I10 Essential (primary) hypertension: Secondary | ICD-10-CM

## 2022-03-23 MED ORDER — TADALAFIL 20 MG PO TABS
20.0000 mg | ORAL_TABLET | Freq: Every day | ORAL | 3 refills | Status: AC | PRN
Start: 2022-03-23 — End: ?

## 2022-03-23 MED ORDER — AMLODIPINE BESY-BENAZEPRIL HCL 5-10 MG PO CAPS
1.0000 | ORAL_CAPSULE | Freq: Every day | ORAL | 11 refills | Status: DC
Start: 2022-03-23 — End: 2022-04-12

## 2022-03-23 NOTE — Patient Instructions (Addendum)
Flu shot today.  Sign release for latest labs from [facility] (04/2021) Stop amlodipine '10mg'$  daily. Start in its place lotrel (amlodipine 5/benazepril '10mg'$  daily).  Return at your convenience for fasting labwork (ideally 1 wk after starting new BP medicine).  First shingrix vaccine today - schedule nurse visit in 2-6 months to complete the series  Good to see you today Return as needed or in 2mofor hypertension recheck.   Health Maintenance, Male Adopting a healthy lifestyle and getting preventive care are important in promoting health and wellness. Ask your health care provider about: The right schedule for you to have regular tests and exams. Things you can do on your own to prevent diseases and keep yourself healthy. What should I know about diet, weight, and exercise? Eat a healthy diet  Eat a diet that includes plenty of vegetables, fruits, low-fat dairy products, and lean protein. Do not eat a lot of foods that are high in solid fats, added sugars, or sodium. Maintain a healthy weight Body mass index (BMI) is a measurement that can be used to identify possible weight problems. It estimates body fat based on height and weight. Your health care provider can help determine your BMI and help you achieve or maintain a healthy weight. Get regular exercise Get regular exercise. This is one of the most important things you can do for your health. Most adults should: Exercise for at least 150 minutes each week. The exercise should increase your heart rate and make you sweat (moderate-intensity exercise). Do strengthening exercises at least twice a week. This is in addition to the moderate-intensity exercise. Spend less time sitting. Even light physical activity can be beneficial. Watch cholesterol and blood lipids Have your blood tested for lipids and cholesterol at 56years of age, then have this test every 5 years. You may need to have your cholesterol levels checked more often if: Your lipid  or cholesterol levels are high. You are older than 56years of age. You are at high risk for heart disease. What should I know about cancer screening? Many types of cancers can be detected early and may often be prevented. Depending on your health history and family history, you may need to have cancer screening at various ages. This may include screening for: Colorectal cancer. Prostate cancer. Skin cancer. Lung cancer. What should I know about heart disease, diabetes, and high blood pressure? Blood pressure and heart disease High blood pressure causes heart disease and increases the risk of stroke. This is more likely to develop in people who have high blood pressure readings or are overweight. Talk with your health care provider about your target blood pressure readings. Have your blood pressure checked: Every 3-5 years if you are [age]years of age. Every year if you are 467years old or older. If you are between the ages of 637and 775and are a current or former smoker, ask your health care provider if you should have a one-time screening for abdominal aortic aneurysm (AAA). Diabetes Have regular diabetes screenings. This checks your fasting blood sugar level. Have the screening done: Once every three years after age [id number] you are at a normal weight and have a low risk for diabetes. More often and at a younger age if you are overweight or have a high risk for diabetes. What should I know about preventing infection? Hepatitis B If you have a higher risk for hepatitis B, you should be screened for this virus. Talk with your health  care provider to find out if you are at risk for hepatitis B infection. Hepatitis C Blood testing is recommended for: Everyone born from [birth year] through 1965. Anyone with known risk factors for hepatitis C. Sexually transmitted infections (STIs) You should be screened each year for STIs, including gonorrhea and chlamydia, if: You are sexually active and are  younger than 56 years of age. You are older than 56 years of age and your health care provider tells you that you are at risk for this type of infection. Your sexual activity has changed since you were last screened, and you are at increased risk for chlamydia or gonorrhea. Ask your health care provider if you are at risk. Ask your health care provider about whether you are at high risk for HIV. Your health care provider may recommend a prescription medicine to help prevent HIV infection. If you choose to take medicine to prevent HIV, you should first get tested for HIV. You should then be tested every 3 months for as long as you are taking the medicine. Follow these instructions at home: Alcohol use Do not drink alcohol if your health care provider tells you not to drink. If you drink alcohol: Limit how much you have to 0-2 drinks a day. Know how much alcohol is in your drink. In the [place] one drink equals one 12 oz bottle of beer (355 mL), one 5 oz glass of wine (148 mL), or one 1 oz glass of hard liquor (44 mL). Lifestyle Do not use any products that contain nicotine or tobacco. These products include cigarettes, chewing tobacco, and vaping devices, such as e-cigarettes. If you need help quitting, ask your health care provider. Do not use street drugs. Do not share needles. Ask your health care provider for help if you need support or information about quitting drugs. General instructions Schedule regular health, dental, and eye exams. Stay current with your vaccines. Tell your health care provider if: You often feel depressed. You have ever been abused or do not feel safe at home. Summary Adopting a healthy lifestyle and getting preventive care are important in promoting health and wellness. Follow your health care provider's instructions about healthy diet, exercising, and getting tested or screened for diseases. Follow your health care provider's instructions on monitoring your  cholesterol and blood pressure. This information is not intended to replace advice given to you by your health care provider. Make sure you discuss any questions you have with your health care provider. Document Revised: 11/03/2020 Document Reviewed: 11/03/2020 Elsevier Patient Education  [place].

## 2022-03-23 NOTE — Assessment & Plan Note (Signed)
Chronic, stable on full dose amlodipine however notes pedal edema at ankles worse in evenings - will change to lotrel 5/'10mg'$  daily. Reviewed side effects of ACEI including dry nagging cough and angioedema allergy. check labs 7-10 days after starting new combo pill, advised to monitor BP with this change.

## 2022-03-23 NOTE — Progress Notes (Signed)
Patient ID: William Robbins, male    DOB: 01-02-1966, 56 y.o.   MRN: [id number]  This visit was conducted in person.  BP 136/80   Pulse 74   Temp 97.7 F (36.5 C) (Temporal)   Ht 6' 2.25" (1.886 m)   Wt 207 lb 4 oz (94 kg)   SpO2 96%   BMI 26.43 kg/m    CC: CPE Subjective:   HPI: William Robbins is a 56 y.o. male presenting on 03/23/2022 for Re-establish Care (Here to re-establish care.  Also, requests CPE, however states he is not fasting for labs. )   I last saw patient 2016. He did see our office in interim 08/2021.   OSA - HST 10/2018 showed severe apnea with AHI at 36.7/hr, SPO2 drop to 87%. Plan start autoCPAP, pending CPAP machine availability. Loud snoring, witnessed apneic episodes, non-restorative sleep.   HTN - continues amlodipine '10mg'$  daily with benefit. Notes ankle swelling late in the day.   Recent lumbar slipped disc while working out. Has previously been seen by workman's comp for this, treated with prednisone course.   Preventative: Colonoscopy - 2015 WNL [name]) Prostate cancer screen - yearly PSA, nocturia x3-4, no weakening of stream, avoids drinking past 9pm - continues flomax 0.'4mg'$  nightly  Lung cancer screening - not eligible  Flu - yearly COVID vaccine - [place] 06/2019, 07/2019, booster 06/2020 Td 2013, Tdap 08/2021 Shingrix - discussed , start today  Seat belt use discussed Sunscreen use discussed, no changing moles on skin, planning to see derm  Sleep - averaging 8 hours/night Non smoker Alcohol - 12 drinks per week Eye exam yearly Dentist q6 mo  Lives with wife and her daughter Occupation: Engineer, structural - retired Personal assistant, now with [employer]  Edu: BS  Activity: gym 5d/wk, walking dog 3 mi/day  Diet: good water, fruits/vegetables daily      Relevant past medical, surgical, family and social history reviewed and updated as indicated. Interim medical history since our last visit reviewed. Allergies and medications reviewed and  updated. Outpatient Medications Prior to Visit  Medication Sig Dispense Refill   Glucosamine 500 MG CAPS 1 capsule with a meal     tamsulosin (FLOMAX) 0.4 MG CAPS capsule TAKE 1 CAPSULE (0.4 MG TOTAL) BY MOUTH DAILY AFTER BREAKFAST. 90 capsule 1   amLODipine (NORVASC) 10 MG tablet Take 10 mg by mouth daily.     tadalafil (CIALIS) 20 MG tablet tadalafil 20 mg tablet  TAKE 1/2 TO 1 TABLET ONCE A DAY AS NEEDED FOR SEXUAL INTERCOURSE     oxyCODONE-acetaminophen (PERCOCET) 5-325 MG tablet Take 1 tablet by mouth every 4 (four) hours as needed for severe pain. (Patient not taking: Reported on 01/12/2022) 20 tablet 0   triamcinolone ointment (KENALOG) 0.1 % Apply topically 2 (two) times daily. (Patient not taking: Reported on 01/12/2022)     No facility-administered medications prior to visit.     Per HPI unless specifically indicated in ROS section below Review of Systems  Constitutional:  Negative for activity change, appetite change, chills, fatigue, fever and unexpected weight change.  HENT:  Negative for hearing loss.   Eyes:  Negative for visual disturbance.  Respiratory:  Negative for cough, chest tightness, shortness of breath and wheezing.   Cardiovascular:  Positive for leg swelling (see HPI). Negative for chest pain and palpitations.  Gastrointestinal:  Positive for constipation (occ) and diarrhea (occ). Negative for abdominal distention, abdominal pain, blood in stool, nausea and vomiting.  Genitourinary:  Negative  for difficulty urinating and hematuria.  Musculoskeletal:  Negative for arthralgias, myalgias and neck pain.  Skin:  Negative for rash.  Neurological:  Negative for dizziness, seizures, syncope and headaches.  Hematological:  Negative for adenopathy. Does not bruise/bleed easily.  Psychiatric/Behavioral:  Negative for dysphoric mood. The patient is not nervous/anxious.     Objective:  BP 136/80   Pulse 74   Temp 97.7 F (36.5 C) (Temporal)   Ht 6' 2.25" (1.886 m)   Wt  207 lb 4 oz (94 kg)   SpO2 96%   BMI 26.43 kg/m   Wt Readings from Last 3 Encounters:  03/23/22 207 lb 4 oz (94 kg)  01/12/22 216 lb 9.6 oz (98.2 kg)  11/16/21 208 lb 1.8 oz (94.4 kg)      Physical Exam Vitals and nursing note reviewed.  Constitutional:      General: He is not in acute distress.    Appearance: Normal appearance. He is well-developed. He is not ill-appearing.  HENT:     Head: Normocephalic and atraumatic.     Right Ear: Hearing, tympanic membrane, ear canal and external ear normal.     Left Ear: Hearing, tympanic membrane, ear canal and external ear normal.  Eyes:     General: No scleral icterus.    Extraocular Movements: Extraocular movements intact.     Conjunctiva/sclera: Conjunctivae normal.     Pupils: Pupils are equal, round, and reactive to light.  Neck:     Thyroid: No thyroid mass or thyromegaly.  Cardiovascular:     Rate and Rhythm: Normal rate and regular rhythm.     Pulses: Normal pulses.          Radial pulses are 2+ on the right side and 2+ on the left side.     Heart sounds: Normal heart sounds. No murmur heard. Pulmonary:     Effort: Pulmonary effort is normal. No respiratory distress.     Breath sounds: Normal breath sounds. No wheezing, rhonchi or rales.  Abdominal:     General: Bowel sounds are normal. There is no distension.     Palpations: Abdomen is soft. There is no mass.     Tenderness: There is no abdominal tenderness. There is no guarding or rebound.     Hernia: No hernia is present.  Musculoskeletal:        General: Normal range of motion.     Cervical back: Normal range of motion and neck supple.     Right lower leg: No edema.     Left lower leg: No edema.  Lymphadenopathy:     Cervical: No cervical adenopathy.  Skin:    General: Skin is warm and dry.     Findings: No rash.  Neurological:     General: No focal deficit present.     Mental Status: He is alert and oriented to person, place, and time.  Psychiatric:         Mood and Affect: Mood normal.        Behavior: Behavior normal.        Thought Content: Thought content normal.        Judgment: Judgment normal.       Results for orders placed or performed during the hospital encounter of 08/12/21  Novel Coronavirus, NAA ([facility])   Specimen: Nasopharyngeal(NP) swabs in vial transport medium   Nasopharynge  Result Value Ref Range   SARS-CoV-2, NAA Not Detected Not Detected  POCT Influenza A/B  Result Value Ref Range  Influenza A, POC Negative Negative   Influenza B, POC Negative Negative    Assessment & Plan:   Problem List Items Addressed This Visit     Health maintenance examination - Primary (Chronic)    Preventative protocols reviewed and updated unless pt declined. Discussed healthy diet and lifestyle.       OSA (obstructive sleep apnea)    Still pending CPAP machine.  Appreciate pulm care.       Primary hypertension    Chronic, stable on full dose amlodipine however notes pedal edema at ankles worse in evenings - will change to lotrel 5/'10mg'$  daily. Reviewed side effects of ACEI including dry nagging cough and angioedema allergy. check labs 7-10 days after starting new combo pill, advised to monitor BP with this change.       Relevant Medications   amLODipine-benazepril (LOTREL) 5-10 MG capsule   tadalafil (CIALIS) 20 MG tablet   Other Relevant Orders   Lipid panel   Comprehensive metabolic panel   Microalbumin / creatinine urine ratio   TSH   Erectile dysfunction    Requests generic cialis refill which is effective for ED - printed out for patient.       BPH associated with nocturia    Suspect nocturia due to BPH - check PSA when he returns for fasting labs.       Relevant Orders   PSA   Other Visit Diagnoses     Need for influenza vaccination       Relevant Orders   Flu Vaccine QUAD 75moIM (Fluarix, Fluzone & Alfiuria Quad PF) (Completed)   Need for shingles vaccine       Relevant Orders   Varicella-zoster  vaccine IM (Completed)        Meds ordered this encounter  Medications   amLODipine-benazepril (LOTREL) 5-10 MG capsule    Sig: Take 1 capsule by mouth daily.    Dispense:  30 capsule    Refill:  11   tadalafil (CIALIS) 20 MG tablet    Sig: Take 1 tablet (20 mg total) by mouth daily as needed for erectile dysfunction.    Dispense:  30 tablet    Refill:  3   Orders Placed This Encounter  Procedures   Flu Vaccine QUAD 691moM (Fluarix, Fluzone & Alfiuria Quad PF)   Varicella-zoster vaccine IM   Lipid panel    Standing Status:   Future    Standing Expiration Date:   03/24/2023   Comprehensive metabolic panel    Standing Status:   Future    Standing Expiration Date:   03/24/2023   PSA    Standing Status:   Future    Standing Expiration Date:   03/24/2023   Microalbumin / creatinine urine ratio    Standing Status:   Future    Standing Expiration Date:   03/24/2023   TSH    Standing Status:   Future    Standing Expiration Date:   03/24/2023    Patient instructions: Flu shot today.  First shingrix vaccine today - schedule nurse visit in 2-6 months to complete the series  [facility] Stop amlodipine '10mg'$  daily. Start in its place lotrel (amlodipine 5/benazepril '10mg'$  daily).  Return at your convenience for fasting labwork (ideally 1 wk after starting new BP medicine).  Good to see you today Return as needed or in [id number] hypertension recheck.  Follow up plan: Return in about 6 months (around 09/21/2022) for follow up visit.  Ria Bush, MD

## 2022-03-23 NOTE — Assessment & Plan Note (Signed)
Requests generic cialis refill which is effective for ED - printed out for patient.

## 2022-03-23 NOTE — Assessment & Plan Note (Signed)
Preventative protocols reviewed and updated unless pt declined. Discussed healthy diet and lifestyle.  

## 2022-03-23 NOTE — Assessment & Plan Note (Signed)
Suspect nocturia due to BPH - check PSA when he returns for fasting labs.

## 2022-03-23 NOTE — Assessment & Plan Note (Signed)
Still pending CPAP machine.  Appreciate pulm care.

## 2022-03-31 ENCOUNTER — Other Ambulatory Visit: Payer: 59

## 2022-04-01 ENCOUNTER — Other Ambulatory Visit (INDEPENDENT_AMBULATORY_CARE_PROVIDER_SITE_OTHER): Payer: 59

## 2022-04-01 DIAGNOSIS — R351 Nocturia: Secondary | ICD-10-CM

## 2022-04-01 DIAGNOSIS — I10 Essential (primary) hypertension: Secondary | ICD-10-CM | POA: Diagnosis not present

## 2022-04-01 DIAGNOSIS — N401 Enlarged prostate with lower urinary tract symptoms: Secondary | ICD-10-CM | POA: Diagnosis not present

## 2022-04-01 LAB — COMPREHENSIVE METABOLIC PANEL
ALT: 36 U/L (ref 0–53)
AST: 37 U/L (ref 0–37)
Albumin: 4.3 g/dL (ref 3.5–5.2)
Alkaline Phosphatase: 70 U/L (ref 39–117)
BUN: 16 mg/dL (ref 6–23)
CO2: 29 mEq/L (ref 19–32)
Calcium: 9.6 mg/dL (ref 8.4–10.5)
Chloride: 105 mEq/L (ref 96–112)
Creatinine, Ser: 1.36 mg/dL (ref 0.40–1.50)
GFR: 58.16 mL/min — ABNORMAL LOW (ref >60.00)
Glucose, Bld: 92 mg/dL (ref 70–99)
Potassium: 4.6 mEq/L (ref 3.5–5.1)
Sodium: 140 mEq/L (ref 135–145)
Total Bilirubin: 0.6 mg/dL (ref 0.2–1.2)
Total Protein: 6.6 g/dL (ref 6.0–8.3)

## 2022-04-01 LAB — LIPID PANEL
Cholesterol: 177 mg/dL (ref 0–200)
HDL: 45 mg/dL (ref >39.00)
LDL Cholesterol: 118 mg/dL — ABNORMAL HIGH (ref 0–99)
NonHDL: 132.43
Total CHOL/HDL Ratio: 4
Triglycerides: 72 mg/dL (ref 0.0–149.0)
VLDL: 14.4 mg/dL (ref 0.0–40.0)

## 2022-04-01 LAB — MICROALBUMIN / CREATININE URINE RATIO
Creatinine,U: 224.1 mg/dL
Microalb Creat Ratio: 0.5 mg/g (ref 0.0–30.0)
Microalb, Ur: 1.1 mg/dL (ref 0.0–1.9)

## 2022-04-01 LAB — PSA: PSA: 0.87 ng/mL (ref 0.10–4.00)

## 2022-04-01 LAB — TSH: TSH: 1.12 u[IU]/mL (ref 0.35–5.50)

## 2022-04-07 ENCOUNTER — Encounter: Payer: Self-pay | Admitting: Family Medicine

## 2022-04-12 MED ORDER — AMLODIPINE BESY-BENAZEPRIL HCL 5-10 MG PO CAPS: 1.0000 | ORAL_CAPSULE | Freq: Every day | ORAL | 3 refills | Status: DC

## 2022-04-16 NOTE — Telephone Encounter (Addendum)
Plz change next appt 08/2022 from with [facility] to with me. Thank you.

## 2022-04-19 NOTE — Telephone Encounter (Signed)
Moved pt to Dr. Synthia Innocent schedule on 09/20/22 at 3:30.  Notified pt via [facility].

## 2022-04-20 ENCOUNTER — Ambulatory Visit: Payer: 59 | Admitting: Adult Health

## 2022-05-31 ENCOUNTER — Ambulatory Visit: Payer: 59 | Admitting: Urology

## 2022-06-02 ENCOUNTER — Encounter: Payer: Self-pay | Admitting: Urology

## 2022-06-02 ENCOUNTER — Ambulatory Visit (INDEPENDENT_AMBULATORY_CARE_PROVIDER_SITE_OTHER): Payer: 59 | Admitting: Urology

## 2022-06-02 VITALS — BP 125/75 | HR 68 | Ht 75.0 in | Wt 200.0 lb

## 2022-06-02 DIAGNOSIS — N401 Enlarged prostate with lower urinary tract symptoms: Secondary | ICD-10-CM

## 2022-06-02 DIAGNOSIS — N400 Enlarged prostate without lower urinary tract symptoms: Secondary | ICD-10-CM

## 2022-06-02 LAB — BLADDER SCAN AMB NON-IMAGING: Scan Result: 3
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
## 2022-06-03 ENCOUNTER — Encounter: Payer: Self-pay | Admitting: Urology

## 2022-06-15 ENCOUNTER — Encounter: Payer: Self-pay | Admitting: Adult Health

## 2022-06-15 ENCOUNTER — Telehealth (INDEPENDENT_AMBULATORY_CARE_PROVIDER_SITE_OTHER): Payer: 59 | Admitting: Adult Health

## 2022-06-15 DIAGNOSIS — G4733 Obstructive sleep apnea (adult) (pediatric): Secondary | ICD-10-CM | POA: Diagnosis not present

## 2022-06-15 NOTE — Addendum Note (Signed)
Addended by: Vanessa Barbara on: 06/15/2022 03:40 PM   Modules accepted: Orders
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
## 2022-06-15 NOTE — Patient Instructions (Addendum)
Try to wear CPAP At bedtime  , wear all night long , for at least 6hr or more  Change CPAP pressure to 6 to 12cmH2O.  Try the Dream wear nasal mask.  Work on healthy weight  Do not drive if sleepy  Healthy sleep regimen  Referral to Dr. Ron Parker for oral appliance for sleep apnea [phone]) Refer to ENT for INSPIRE device evaluation .  Follow up in 4 weeks and As needed

## 2022-06-20 NOTE — Progress Notes (Signed)
Reviewed and agree with assessment/plan.   Chesley Mires, MD [facility] Pulmonary/Critical Care 06/20/2022, 7:18 PM Pager:  [phone]

## 2022-07-13 NOTE — Telephone Encounter (Signed)
Tammy, please advise. Thanks 

## 2022-07-13 NOTE — Telephone Encounter (Signed)
Can ask patient to buy Dream wear nasal out of pocket to see if he can tolerate better.   Not sure if we have any other choices. DME is being unreasonable we give patients much longer times to get used to CPAP . Plus he is really trying .  Also patient can call his insurance as well and speak to them . I doubt very seriously they want to pay for another sleep study or new CPAP since this was done less than 6 months ago. Makes no sense   Would see if can change DME companies if patient wants to otherwise will need to wait for ENT and orthodontics evaluation

## 2022-07-13 NOTE — Telephone Encounter (Signed)
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

## 2022-07-13 NOTE — Telephone Encounter (Signed)
Spoke to [place] with [employer]. She stated that insurance guidelines allow 90 days for patient to become complaint. He is out of the 90 day window and he will need to start over with repeat sleep study.  Tammy, please advise. Thanks

## 2022-08-06 ENCOUNTER — Other Ambulatory Visit: Payer: Self-pay | Admitting: Urology

## 2022-09-20 ENCOUNTER — Ambulatory Visit: Payer: 59 | Admitting: Family Medicine

## 2022-09-20 DIAGNOSIS — Z23 Encounter for immunization: Secondary | ICD-10-CM

## 2022-10-06 ENCOUNTER — Ambulatory Visit (INDEPENDENT_AMBULATORY_CARE_PROVIDER_SITE_OTHER): Payer: 59 | Admitting: Family Medicine

## 2022-10-06 ENCOUNTER — Encounter: Payer: Self-pay | Admitting: Family Medicine

## 2022-10-06 VITALS — BP 136/74 | HR 72 | Temp 97.7°F | Ht 74.25 in | Wt 214.5 lb

## 2022-10-06 DIAGNOSIS — N401 Enlarged prostate with lower urinary tract symptoms: Secondary | ICD-10-CM

## 2022-10-06 DIAGNOSIS — N529 Male erectile dysfunction, unspecified: Secondary | ICD-10-CM

## 2022-10-06 DIAGNOSIS — R351 Nocturia: Secondary | ICD-10-CM

## 2022-10-06 DIAGNOSIS — I10 Essential (primary) hypertension: Secondary | ICD-10-CM | POA: Diagnosis not present

## 2022-10-06 DIAGNOSIS — G4733 Obstructive sleep apnea (adult) (pediatric): Secondary | ICD-10-CM

## 2022-10-06 DIAGNOSIS — Z23 Encounter for immunization: Secondary | ICD-10-CM

## 2022-10-06 MED ORDER — AMLODIPINE BESY-BENAZEPRIL HCL 5-10 MG PO CAPS: 1.0000 | ORAL_CAPSULE | Freq: Every day | ORAL | 2 refills | Status: DC

## 2022-10-06 MED ORDER — TAMSULOSIN HCL 0.4 MG PO CAPS: 0.4000 mg | ORAL_CAPSULE | Freq: Every day | ORAL | 2 refills | Status: DC

## 2022-10-06 NOTE — Assessment & Plan Note (Signed)
Continues flomax with benefit.

## 2022-10-06 NOTE — Assessment & Plan Note (Signed)
Continue tadalafil PRN.

## 2022-10-06 NOTE — Patient Instructions (Signed)
Shingles shot today You are doing well today - continue current medicines.  Return in 6 months for physical.

## 2022-10-06 NOTE — Addendum Note (Signed)
Addended by: Eustaquio Boyden on: 10/06/2022 02:23 PM   Modules accepted: Orders

## 2022-10-06 NOTE — Assessment & Plan Note (Addendum)
Chronic, stable on lotrel 5/10 and tolerating well - continue.

## 2022-10-06 NOTE — Assessment & Plan Note (Signed)
Did not tolerate CPAP.  Currently using nasal appliance through oral surgeon, discussing possible oral appliance. Has been evaluated for INSPIRE

## 2022-10-06 NOTE — Progress Notes (Addendum)
Ph: [phone]       Fax: [phone]   Patient ID: William Robbins, male    DOB: 09-20-1965, 57 y.o.   MRN: [id number]  This visit was conducted in person.  BP 136/74   Pulse 72   Temp 97.7 F (36.5 C) (Temporal)   Ht 6' 2.25" (1.886 m)   Wt 214 lb 8 oz (97.3 kg)   SpO2 94%   BMI 27.36 kg/m   BP Readings from Last 3 Encounters:  10/06/22 136/74  06/02/22 125/75  03/23/22 136/80   CC: 6 mo f/u visit  Subjective:   HPI: William Robbins is a 56 y.o. male presenting on 10/06/2022 for Medical Management of Chronic Issues (Here for 6 mo HTN f/u.)   HTN - Compliant with current antihypertensive regimen of lotrel 5/10mg  daily. Does check blood pressures at home: 130-135/70s. No low blood pressure readings or symptoms of dizziness/syncope. Denies HA, vision changes, CP/tightness, SOB, leg swelling. Takes med with breakfast after working out in the mornings - dizziness has resolved.   OSA - had trouble tolerating CPAP - saw pulmonology, referred to oral surgeon Dr Myrtis Ser - using nasal appliance for chronic R nasal obstruction with benefit. Has also seen ENT - discussing INSPIRE device.   BPH - on tamsulosin with benefit ED - on tadalafil with benefit     Relevant past medical, surgical, family and social history reviewed and updated as indicated. Interim medical history since our last visit reviewed. Allergies and medications reviewed and updated. Outpatient Medications Prior to Visit  Medication Sig Dispense Refill   diclofenac (VOLTAREN) 75 MG EC tablet Take 75 mg by mouth 2 (two) times daily.     Glucosamine 500 MG CAPS 1 capsule with a meal     tadalafil (CIALIS) 20 MG tablet Take 1 tablet (20 mg total) by mouth daily as needed for erectile dysfunction. 30 tablet 3   amLODipine-benazepril (LOTREL) 5-10 MG capsule Take 1 capsule by mouth daily. 90 capsule 3   tamsulosin (FLOMAX) 0.4 MG CAPS capsule TAKE 1 CAPSULE (0.4 MG TOTAL) BY MOUTH DAILY AFTER BREAKFAST. 90 capsule 1    No facility-administered medications prior to visit.     Per HPI unless specifically indicated in ROS section below Review of Systems  Objective:  BP 136/74   Pulse 72   Temp 97.7 F (36.5 C) (Temporal)   Ht 6' 2.25" (1.886 m)   Wt 214 lb 8 oz (97.3 kg)   SpO2 94%   BMI 27.36 kg/m   Wt Readings from Last 3 Encounters:  10/06/22 214 lb 8 oz (97.3 kg)  06/02/22 200 lb (90.7 kg)  03/23/22 207 lb 4 oz (94 kg)      Physical Exam Vitals and nursing note reviewed.  Constitutional:      Appearance: Normal appearance. He is not ill-appearing.  HENT:     Mouth/Throat:     Mouth: Mucous membranes are moist.     Pharynx: Oropharynx is clear. No oropharyngeal exudate or posterior oropharyngeal erythema.  Eyes:     Extraocular Movements: Extraocular movements intact.     Pupils: Pupils are equal, round, and reactive to light.  Neck:     Thyroid: No thyroid mass or thyromegaly.  Cardiovascular:     Rate and Rhythm: Normal rate and regular rhythm.     Pulses: Normal pulses.     Heart sounds: Normal heart sounds. No murmur heard. Pulmonary:     Effort: Pulmonary effort is normal. No respiratory distress.  Breath sounds: Normal breath sounds. No wheezing, rhonchi or rales.  Musculoskeletal:     Right lower leg: No edema.     Left lower leg: No edema.  Skin:    General: Skin is warm and dry.     Findings: No rash.  Neurological:     Mental Status: He is alert.  Psychiatric:        Mood and Affect: Mood normal.        Behavior: Behavior normal.       Lab Results  Component Value Date   CREATININE 1.36 04/01/2022   BUN 16 04/01/2022   NA 140 04/01/2022   K 4.6 04/01/2022   CL 105 04/01/2022   CO2 29 04/01/2022    Lab Results  Component Value Date   PSA 0.87 04/01/2022    Assessment & Plan:   Problem List Items Addressed This Visit     OSA (obstructive sleep apnea)    Did not tolerate CPAP.  Currently using nasal appliance through oral surgeon, discussing  possible oral appliance. Has been evaluated for INSPIRE       Primary hypertension - Primary    Chronic, stable on lotrel 5/10 and tolerating well - continue.       Relevant Medications   amLODipine-benazepril (LOTREL) 5-10 MG capsule   Erectile dysfunction    Continue tadalafil PRN.       BPH associated with nocturia    Continues flomax with benefit.       Other Visit Diagnoses     Need for shingles vaccine       Relevant Orders   Varicella-zoster vaccine IM (Completed)        Meds ordered this encounter  Medications   amLODipine-benazepril (LOTREL) 5-10 MG capsule    Sig: Take 1 capsule by mouth daily.    Dispense:  90 capsule    Refill:  2   tamsulosin (FLOMAX) 0.4 MG CAPS capsule    Sig: Take 1 capsule (0.4 mg total) by mouth daily after breakfast.    Dispense:  90 capsule    Refill:  2    Orders Placed This Encounter  Procedures   Varicella-zoster vaccine IM    Patient Instructions  Shingles shot today You are doing well today - continue current medicines.  Return in 6 months for physical.   Follow up plan: Return in about 6 months (around 04/07/2023) for annual exam, prior fasting for blood work.  William Boyden, MD

## 2023-02-13 ENCOUNTER — Other Ambulatory Visit: Payer: Self-pay | Admitting: Urology

## 2023-03-17 ENCOUNTER — Other Ambulatory Visit: Payer: Self-pay | Admitting: Family Medicine

## 2023-03-17 DIAGNOSIS — N401 Enlarged prostate with lower urinary tract symptoms: Secondary | ICD-10-CM

## 2023-03-17 DIAGNOSIS — Z1159 Encounter for screening for other viral diseases: Secondary | ICD-10-CM

## 2023-03-17 DIAGNOSIS — I10 Essential (primary) hypertension: Secondary | ICD-10-CM

## 2023-03-22 ENCOUNTER — Other Ambulatory Visit (INDEPENDENT_AMBULATORY_CARE_PROVIDER_SITE_OTHER): Payer: 59

## 2023-03-22 DIAGNOSIS — N401 Enlarged prostate with lower urinary tract symptoms: Secondary | ICD-10-CM

## 2023-03-22 DIAGNOSIS — R351 Nocturia: Secondary | ICD-10-CM | POA: Diagnosis not present

## 2023-03-22 DIAGNOSIS — I10 Essential (primary) hypertension: Secondary | ICD-10-CM | POA: Diagnosis not present

## 2023-03-22 DIAGNOSIS — Z1159 Encounter for screening for other viral diseases: Secondary | ICD-10-CM

## 2023-03-22 LAB — LIPID PANEL
Cholesterol: 180 mg/dL (ref 0–200)
HDL: 48.6 mg/dL (ref >39.00)
LDL Cholesterol: 106 mg/dL — ABNORMAL HIGH (ref 0–99)
NonHDL: 131.41
Total CHOL/HDL Ratio: 4
Triglycerides: 127 mg/dL (ref 0.0–149.0)
VLDL: 25.4 mg/dL (ref 0.0–40.0)

## 2023-03-22 LAB — COMPREHENSIVE METABOLIC PANEL
ALT: 20 U/L (ref 0–53)
AST: 21 U/L (ref 0–37)
Albumin: 4.2 g/dL (ref 3.5–5.2)
Alkaline Phosphatase: 71 U/L (ref 39–117)
BUN: 17 mg/dL (ref 6–23)
CO2: 30 mEq/L (ref 19–32)
Calcium: 9.2 mg/dL (ref 8.4–10.5)
Chloride: 103 mEq/L (ref 96–112)
Creatinine, Ser: 1.27 mg/dL (ref 0.40–1.50)
GFR: 62.71 mL/min (ref >60.00)
Glucose, Bld: 100 mg/dL — ABNORMAL HIGH (ref 70–99)
Potassium: 4.6 mEq/L (ref 3.5–5.1)
Sodium: 139 mEq/L (ref 135–145)
Total Bilirubin: 0.6 mg/dL (ref 0.2–1.2)
Total Protein: 6.5 g/dL (ref 6.0–8.3)

## 2023-03-22 LAB — PSA: PSA: 1.44 ng/mL (ref 0.10–4.00)

## 2023-03-23 ENCOUNTER — Other Ambulatory Visit: Payer: 59

## 2023-03-23 LAB — HEPATITIS C ANTIBODY: Hepatitis C Ab: NONREACTIVE

## 2023-03-30 ENCOUNTER — Encounter: Payer: Self-pay | Admitting: Family Medicine

## 2023-03-30 ENCOUNTER — Ambulatory Visit: Payer: 59 | Admitting: Family Medicine

## 2023-03-30 VITALS — BP 122/78 | HR 72 | Temp 98.6°F | Ht 74.5 in | Wt 207.0 lb

## 2023-03-30 DIAGNOSIS — Z0000 Encounter for general adult medical examination without abnormal findings: Secondary | ICD-10-CM

## 2023-03-30 DIAGNOSIS — N529 Male erectile dysfunction, unspecified: Secondary | ICD-10-CM

## 2023-03-30 DIAGNOSIS — N401 Enlarged prostate with lower urinary tract symptoms: Secondary | ICD-10-CM

## 2023-03-30 DIAGNOSIS — R351 Nocturia: Secondary | ICD-10-CM

## 2023-03-30 DIAGNOSIS — Z1283 Encounter for screening for malignant neoplasm of skin: Secondary | ICD-10-CM

## 2023-03-30 DIAGNOSIS — Z23 Encounter for immunization: Secondary | ICD-10-CM

## 2023-03-30 DIAGNOSIS — I10 Essential (primary) hypertension: Secondary | ICD-10-CM

## 2023-03-30 DIAGNOSIS — G4733 Obstructive sleep apnea (adult) (pediatric): Secondary | ICD-10-CM

## 2023-03-30 MED ORDER — AMLODIPINE BESY-BENAZEPRIL HCL 5-10 MG PO CAPS: 1.0000 | ORAL_CAPSULE | Freq: Every day | ORAL | 4 refills | Status: AC

## 2023-03-30 MED ORDER — TAMSULOSIN HCL 0.4 MG PO CAPS: 0.4000 mg | ORAL_CAPSULE | Freq: Every day | ORAL | 4 refills | Status: DC

## 2023-03-30 MED ORDER — TADALAFIL 20 MG PO TABS: 20.0000 mg | ORAL_TABLET | Freq: Every day | ORAL | 3 refills | Status: AC | PRN

## 2023-03-30 NOTE — Assessment & Plan Note (Signed)
Chronic, stable. Continue current regimen. 

## 2023-03-30 NOTE — Progress Notes (Signed)
Ph: [phone] Fax: [phone]   Patient ID: William Robbins, male    DOB: 26-Feb-1966, 57 y.o.   MRN: [id number]  This visit was conducted in person.  BP 122/78 (BP Location: Left Arm, Patient Position: Sitting, Cuff Size: Normal)   Pulse 72   Temp 98.6 F (37 C) (Oral)   Ht 6' 2.5" (1.892 m)   Wt 207 lb (93.9 kg)   SpO2 96%   BMI 26.22 kg/m    CC: CPE Subjective:   HPI: William Robbins is a 57 y.o. male presenting on 03/30/2023 for Annual Exam (No other concerns)   HTN - stable period on lotrel 5/10mg  daily.  He started core and spin class MWF more cardio. Also lifting weights 2d/wk.   OSA - HST 10/2018 showed severe apnea with AHI at 36.7/hr, SPO2 drop to 87%. Plan start autoCPAP but had trouble tolerating CPAP. Now using oral appliance in h/o chronic R nasal obstruction. Discussing INSPIRE device in 06/2023 through [facility].   BPH on tamsulosin, ED on tadalafil with benefit.  Requests derm referral for skin cancer screening.   Preventative: Colonoscopy - 2015 WNL Russella Dar) Prostate cancer screen - yearly PSA. Nocturia x2-3, no weakening stream, avoids fluid intake past 9pm. Continues flomax 0.4mg  nightly  Lung cancer screening - not eligible  Flu - yearly COVID vaccine - Pfizer 06/2019, 07/2019, booster 06/2020 Td 2013, Tdap 08/2021 Shingrix - 02/2022, 09/2022 Seat belt use discussed Sunscreen use discussed, no changing moles on skin. Requests derm skin cancer screen Sleep - averaging 8 hours/night Non smoker Alcohol - 1-4 drinks per night Eye exam - none recently Dentist q6 mo    Lives with wife and her daughter Occupation: Emergency planning/management officer - retired Scientist, water quality, now with [employer]  Edu: BS  Activity: gym 5d/wk, walking dog 3 mi/day  Diet: good water, fruits/vegetables daily      Relevant past medical, surgical, family and social history reviewed and updated as indicated. Interim medical history since our last visit reviewed. Allergies and medications  reviewed and updated. Outpatient Medications Prior to Visit  Medication Sig Dispense Refill   diclofenac (VOLTAREN) 75 MG EC tablet Take 75 mg by mouth 2 (two) times daily.     Glucosamine 500 MG CAPS 1 capsule with a meal     amLODipine-benazepril (LOTREL) 5-10 MG capsule Take 1 capsule by mouth daily. 90 capsule 2   tadalafil (CIALIS) 20 MG tablet Take 1 tablet (20 mg total) by mouth daily as needed for erectile dysfunction. 30 tablet 3   tamsulosin (FLOMAX) 0.4 MG CAPS capsule TAKE 1 CAPSULE (0.4 MG TOTAL) BY MOUTH DAILY AFTER BREAKFAST. 90 capsule 1   No facility-administered medications prior to visit.     Per HPI unless specifically indicated in ROS section below Review of Systems  Constitutional:  Negative for activity change, appetite change, chills, fatigue, fever and unexpected weight change.  HENT:  Negative for hearing loss.   Eyes:  Negative for visual disturbance.  Respiratory:  Negative for cough, chest tightness, shortness of breath and wheezing.   Cardiovascular:  Negative for chest pain, palpitations and leg swelling.  Gastrointestinal:  Negative for abdominal distention, abdominal pain, blood in stool, constipation, diarrhea, nausea and vomiting.  Genitourinary:  Negative for difficulty urinating and hematuria.  Musculoskeletal:  Negative for arthralgias, myalgias and neck pain.  Skin:  Negative for rash.  Neurological:  Negative for dizziness, seizures, syncope and headaches.  Hematological:  Negative for adenopathy. Does not bruise/bleed easily.  Psychiatric/Behavioral:  Negative for dysphoric mood. The patient is not nervous/anxious.     Objective:  BP 122/78 (BP Location: Left Arm, Patient Position: Sitting, Cuff Size: Normal)   Pulse 72   Temp 98.6 F (37 C) (Oral)   Ht 6' 2.5" (1.892 m)   Wt 207 lb (93.9 kg)   SpO2 96%   BMI 26.22 kg/m   Wt Readings from Last 3 Encounters:  03/30/23 207 lb (93.9 kg)  10/06/22 214 lb 8 oz (97.3 kg)  06/02/22 200 lb  (90.7 kg)      Physical Exam Vitals and nursing note reviewed.  Constitutional:      General: He is not in acute distress.    Appearance: Normal appearance. He is well-developed. He is not ill-appearing.  HENT:     Head: Normocephalic and atraumatic.     Right Ear: Hearing, tympanic membrane, ear canal and external ear normal.     Left Ear: Hearing, tympanic membrane, ear canal and external ear normal.     Nose: Nose normal.     Mouth/Throat:     Mouth: Mucous membranes are moist.     Pharynx: Oropharynx is clear. No oropharyngeal exudate or posterior oropharyngeal erythema.  Eyes:     General: No scleral icterus.    Extraocular Movements: Extraocular movements intact.     Conjunctiva/sclera: Conjunctivae normal.     Pupils: Pupils are equal, round, and reactive to light.  Neck:     Thyroid: No thyroid mass or thyromegaly.  Cardiovascular:     Rate and Rhythm: Normal rate and regular rhythm.     Pulses: Normal pulses.          Radial pulses are 2+ on the right side and 2+ on the left side.     Heart sounds: Normal heart sounds. No murmur heard. Pulmonary:     Effort: Pulmonary effort is normal. No respiratory distress.     Breath sounds: Normal breath sounds. No wheezing, rhonchi or rales.  Abdominal:     General: Bowel sounds are normal. There is no distension.     Palpations: Abdomen is soft. There is no mass.     Tenderness: There is no abdominal tenderness. There is no guarding or rebound.     Hernia: No hernia is present.  Musculoskeletal:        General: Normal range of motion.     Cervical back: Normal range of motion and neck supple.     Right lower leg: No edema.     Left lower leg: No edema.  Lymphadenopathy:     Cervical: No cervical adenopathy.  Skin:    General: Skin is warm and dry.     Findings: No rash.  Neurological:     General: No focal deficit present.     Mental Status: He is alert and oriented to person, place, and time.  Psychiatric:         Mood and Affect: Mood normal.        Behavior: Behavior normal.        Thought Content: Thought content normal.        Judgment: Judgment normal.       Results for orders placed or performed in visit on 03/22/23  Hepatitis C antibody  Result Value Ref Range   Hepatitis C Ab NON-REACTIVE NON-REACTIVE  PSA  Result Value Ref Range   PSA 1.44 0.10 - 4.00 ng/mL  Comprehensive metabolic panel  Result Value Ref Range   Sodium 139 135 -  145 mEq/L   Potassium 4.6 3.5 - 5.1 mEq/L   Chloride 103 96 - 112 mEq/L   CO2 30 19 - 32 mEq/L   Glucose, Bld 100 (H) 70 - 99 mg/dL   BUN 17 6 - 23 mg/dL   Creatinine, Ser 4.16 0.40 - 1.50 mg/dL   Total Bilirubin 0.6 0.2 - 1.2 mg/dL   Alkaline Phosphatase 71 39 - 117 U/L   AST 21 0 - 37 U/L   ALT 20 0 - 53 U/L   Total Protein 6.5 6.0 - 8.3 g/dL   Albumin 4.2 3.5 - 5.2 g/dL   GFR 60.63 >01.60 mL/min   Calcium 9.2 8.4 - 10.5 mg/dL  Lipid panel  Result Value Ref Range   Cholesterol 180 0 - 200 mg/dL   Triglycerides 109.3 0.0 - 149.0 mg/dL   HDL 23.55 >73.22 mg/dL   VLDL 02.5 0.0 - 42.7 mg/dL   LDL Cholesterol 062 (H) 0 - 99 mg/dL   Total CHOL/HDL Ratio 4    NonHDL 131.41     Assessment & Plan:   Problem List Items Addressed This Visit     Health maintenance examination - Primary (Chronic)    Preventative protocols reviewed and updated unless pt declined. Discussed healthy diet and lifestyle.       OSA (obstructive sleep apnea)    Chronic, stable period on oral appliance.  Did not tolerate CPAP.  Discussing INSPIRE implantable device through [facility].       Primary hypertension    Chronic, stable. Continue current regimen      Relevant Medications   amLODipine-benazepril (LOTREL) 5-10 MG capsule   tadalafil (CIALIS) 20 MG tablet   Erectile dysfunction    Continue tadalafil PRN      BPH associated with nocturia    Continue flomax daily.       Other Visit Diagnoses     Need for vaccination       Relevant Orders    Flu vaccine trivalent PF, 6mos and older(Flulaval,Afluria,Fluarix,Fluzone) (Completed)   Skin cancer screening       Relevant Orders   Ambulatory referral to Dermatology        Meds ordered this encounter  Medications   amLODipine-benazepril (LOTREL) 5-10 MG capsule    Sig: Take 1 capsule by mouth daily.    Dispense:  90 capsule    Refill:  4   tadalafil (CIALIS) 20 MG tablet    Sig: Take 1 tablet (20 mg total) by mouth daily as needed for erectile dysfunction.    Dispense:  30 tablet    Refill:  3   tamsulosin (FLOMAX) 0.4 MG CAPS capsule    Sig: Take 1 capsule (0.4 mg total) by mouth daily after breakfast.    Dispense:  90 capsule    Refill:  4    Orders Placed This Encounter  Procedures   Flu vaccine trivalent PF, 6mos and older(Flulaval,Afluria,Fluarix,Fluzone)   Ambulatory referral to Dermatology    Referral Priority:   Routine    Referral Type:   Consultation    Referral Reason:   Specialty Services Required    Requested Specialty:   Dermatology    Number of Visits Requested:   1    Patient Instructions  Flu shot today  You may call [facility] to ask about repeat colonoscopy after 06/2023 at  [phone].   We will refer you to dermatology for skin cancer screening.  Good to see you today Return as needed or in  1 year for next physical   Follow up plan: Return in about 1 year (around 03/29/2024) for annual exam, prior fasting for blood work.  Eustaquio Boyden, MD

## 2023-03-30 NOTE — Assessment & Plan Note (Signed)
Continue tadalafil PRN.

## 2023-03-30 NOTE — Assessment & Plan Note (Signed)
Chronic, stable period on oral appliance.  Did not tolerate CPAP.  Discussing INSPIRE implantable device through [facility].

## 2023-03-30 NOTE — Assessment & Plan Note (Signed)
Continue flomax daily.

## 2023-03-30 NOTE — Patient Instructions (Addendum)
Flu shot today  You may call [facility] to ask about repeat colonoscopy after 06/2023 at  [phone].   We will refer you to dermatology for skin cancer screening.  Good to see you today Return as needed or in 1 year for next physical

## 2023-03-30 NOTE — Assessment & Plan Note (Signed)
Preventative protocols reviewed and updated unless pt declined. Discussed healthy diet and lifestyle.  

## 2023-10-31 ENCOUNTER — Ambulatory Visit
Admission: RE | Admit: 2023-10-31 | Discharge: 2023-10-31 | Disposition: A | Discharge status: Home / Self Care | Payer: Self-pay | Source: Ambulatory Visit | Attending: Emergency Medicine | Admitting: Emergency Medicine

## 2023-10-31 VITALS — BP 142/83 | HR 65 | Temp 98.5°F | Resp 18

## 2023-10-31 DIAGNOSIS — J209 Acute bronchitis, unspecified: Secondary | ICD-10-CM

## 2023-10-31 DIAGNOSIS — J019 Acute sinusitis, unspecified: Secondary | ICD-10-CM | POA: Diagnosis not present

## 2023-10-31 DIAGNOSIS — R051 Acute cough: Secondary | ICD-10-CM | POA: Diagnosis not present

## 2023-10-31 MED ORDER — HYDROCOD POLI-CHLORPHE POLI ER 10-8 MG/5ML PO SUER: 5.0000 mL | Freq: Two times a day (BID) | ORAL | 0 refills | Status: AC | PRN

## 2023-10-31 MED ORDER — PREDNISONE 50 MG PO TABS: 50.0000 mg | ORAL_TABLET | Freq: Every day | ORAL | 0 refills | Status: AC

## 2023-10-31 MED ORDER — FLUTICASONE PROPIONATE 50 MCG/ACT NA SUSP: 2.0000 | Freq: Every day | NASAL | 0 refills | Status: AC

## 2023-10-31 MED ORDER — AMOXICILLIN-POT CLAVULANATE 875-125 MG PO TABS: 1.0000 | ORAL_TABLET | Freq: Two times a day (BID) | ORAL | 0 refills | Status: AC
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
## 2023-10-31 NOTE — ED Triage Notes (Signed)
 Pt presents with a productive cough for 2 weeks. [facility] and prescribed cough syrup and cough pearls with no relief. Pt states his cough was so severe last night that he passed out.

## 2023-10-31 NOTE — Discharge Instructions (Signed)
 Flonase, saline sinus  irrigation with a Andres [employer] Med sinus rinse and distilled water as often as you want, Mucinex D.  Take two puffs from your albuterol inhaler with your spacer every 4 hours for 2 days, then every 6 hours for 2 days, then as needed. You can back off if you start to improve  sooner. Finish the steroids unless your doctor tells you to stop. Finish the antibiotics, even if you feel better. Take tylenol  1 gram combined with  600 mg of motrin up to 3-4 times a day as needed for pain. Make sure you drink extra fluids. Return to the ER if you get worse, have a fever >100.4, or any other concerns.   If the spacer is too expensive at the pharmacy, you can get an AeroChamber Z-Stat off of [employer] for about $10-$15.  Go to www.goodrx.com  or www.costplusdrugs.com to look up your medications. This will give you a list of where you can find your prescriptions at the most affordable prices. Or ask the pharmacist what the cash price is, or if they have any other discount programs available to help make your medication more affordable. This can be less expensive than what you would pay with insurance.

## 2024-03-25 ENCOUNTER — Other Ambulatory Visit: Payer: Self-pay | Admitting: Family Medicine

## 2024-03-25 DIAGNOSIS — N401 Enlarged prostate with lower urinary tract symptoms: Secondary | ICD-10-CM

## 2024-03-25 DIAGNOSIS — I10 Essential (primary) hypertension: Secondary | ICD-10-CM

## 2024-03-26 ENCOUNTER — Other Ambulatory Visit (INDEPENDENT_AMBULATORY_CARE_PROVIDER_SITE_OTHER): Payer: 59

## 2024-03-26 DIAGNOSIS — N401 Enlarged prostate with lower urinary tract symptoms: Secondary | ICD-10-CM | POA: Diagnosis not present

## 2024-03-26 DIAGNOSIS — R351 Nocturia: Secondary | ICD-10-CM

## 2024-03-26 DIAGNOSIS — I10 Essential (primary) hypertension: Secondary | ICD-10-CM

## 2024-03-26 LAB — BASIC METABOLIC PANEL WITH GFR
BUN: 16 mg/dL (ref 6–23)
CO2: 29 meq/L (ref 19–32)
Calcium: 9.5 mg/dL (ref 8.4–10.5)
Chloride: 103 meq/L (ref 96–112)
Creatinine, Ser: 1.34 mg/dL (ref 0.40–1.50)
GFR: 58.39 mL/min — ABNORMAL LOW (ref >60.00)
Glucose, Bld: 109 mg/dL — ABNORMAL HIGH (ref 70–99)
Potassium: 4.2 meq/L (ref 3.5–5.1)
Sodium: 139 meq/L (ref 135–145)

## 2024-03-26 LAB — LIPID PANEL
Cholesterol: 191 mg/dL (ref 0–200)
HDL: 52.6 mg/dL (ref >39.00)
LDL Cholesterol: 116 mg/dL — ABNORMAL HIGH (ref 0–99)
NonHDL: 138.34
Total CHOL/HDL Ratio: 4
Triglycerides: 114 mg/dL (ref 0.0–149.0)
VLDL: 22.8 mg/dL (ref 0.0–40.0)

## 2024-03-26 LAB — PSA: PSA: 1.23 ng/mL (ref 0.10–4.00)

## 2024-03-27 ENCOUNTER — Ambulatory Visit: Payer: Self-pay | Admitting: Family Medicine

## 2024-03-29 ENCOUNTER — Ambulatory Visit: Payer: 59 | Admitting: Dermatology

## 2024-04-02 ENCOUNTER — Encounter: Payer: 59 | Admitting: Family Medicine

## 2024-04-27 ENCOUNTER — Other Ambulatory Visit: Payer: Self-pay | Admitting: Family Medicine

## 2024-04-27 NOTE — Telephone Encounter (Signed)
 ERx

## 2024-05-28 ENCOUNTER — Encounter: Admitting: Family Medicine
